# Patient Record
Sex: Male | Born: 1965
Health system: Southern US, Community
[De-identification: ages and names within clinical notes are randomized; demographics above are authoritative.]

## PROBLEM LIST (undated history)

## (undated) DIAGNOSIS — M2550 Pain in unspecified joint: Secondary | ICD-10-CM

## (undated) DIAGNOSIS — M549 Dorsalgia, unspecified: Secondary | ICD-10-CM

## (undated) DIAGNOSIS — M25562 Pain in left knee: Secondary | ICD-10-CM

## (undated) DIAGNOSIS — M1990 Unspecified osteoarthritis, unspecified site: Secondary | ICD-10-CM

## (undated) DIAGNOSIS — I10 Essential (primary) hypertension: Secondary | ICD-10-CM

## (undated) DIAGNOSIS — M25561 Pain in right knee: Secondary | ICD-10-CM

## (undated) DIAGNOSIS — M25559 Pain in unspecified hip: Secondary | ICD-10-CM

## (undated) HISTORY — DX: Pain in right knee: M25.561

## (undated) HISTORY — DX: Pain in left knee: M25.562

## (undated) HISTORY — DX: Unspecified osteoarthritis, unspecified site: M19.90

## (undated) HISTORY — DX: Pain in unspecified joint: M25.50

## (undated) HISTORY — DX: Pain in unspecified hip: M25.559

## (undated) HISTORY — DX: Dorsalgia, unspecified: M54.9

---
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
## 1985-11-01 HISTORY — PX: THUMB FUSION: SUR636

## 2010-11-04 ENCOUNTER — Emergency Department (HOSPITAL_COMMUNITY)
Admission: EM | Admit: 2010-11-04 | Discharge: 2010-11-04 | Disposition: A | Discharge status: Home / Self Care | Payer: Private Health Insurance - Indemnity | Attending: Emergency Medicine | Admitting: Emergency Medicine

## 2010-11-04 DIAGNOSIS — M549 Dorsalgia, unspecified: Secondary | ICD-10-CM | POA: Insufficient documentation

## 2010-11-04 LAB — URINALYSIS, ROUTINE W REFLEX MICROSCOPIC
Bilirubin Urine: NEGATIVE
Glucose, UA: NEGATIVE mg/dL
Hgb urine dipstick: NEGATIVE
Nitrite: NEGATIVE
Specific Gravity, Urine: 1.01 (ref 1.005–1.030)
pH: 6.5 (ref 5.0–8.0)

## 2010-11-07 LAB — URINE CULTURE
Colony Count: >=100000
Culture  Setup Time: 201207040124

## 2014-05-22 ENCOUNTER — Ambulatory Visit (HOSPITAL_COMMUNITY)
Admission: RE | Admit: 2014-05-22 | Discharge: 2014-05-22 | Disposition: A | Discharge status: Home / Self Care | Payer: 59 | Source: Ambulatory Visit | Attending: Family Medicine | Admitting: Family Medicine

## 2014-05-22 ENCOUNTER — Other Ambulatory Visit (HOSPITAL_COMMUNITY): Payer: Self-pay | Admitting: Family Medicine

## 2014-05-22 DIAGNOSIS — M25562 Pain in left knee: Secondary | ICD-10-CM

## 2015-03-13 ENCOUNTER — Other Ambulatory Visit (HOSPITAL_COMMUNITY): Payer: Self-pay | Admitting: Internal Medicine

## 2015-03-13 DIAGNOSIS — M5126 Other intervertebral disc displacement, lumbar region: Secondary | ICD-10-CM

## 2015-03-29 ENCOUNTER — Ambulatory Visit (HOSPITAL_COMMUNITY): Payer: 59

## 2015-04-08 ENCOUNTER — Ambulatory Visit (HOSPITAL_COMMUNITY): Payer: 59

## 2016-05-22 IMAGING — CR DG KNEE COMPLETE 4+V*L*
4 series · 4 of 4 positions shown · non-contrast
Comparison: None.

CLINICAL DATA: 48-year-old male with knee pain for the past year.
No injury. Initial encounter.

EXAM:
LEFT KNEE - COMPLETE 4+ VIEW

[view not recorded (1 of 4)]
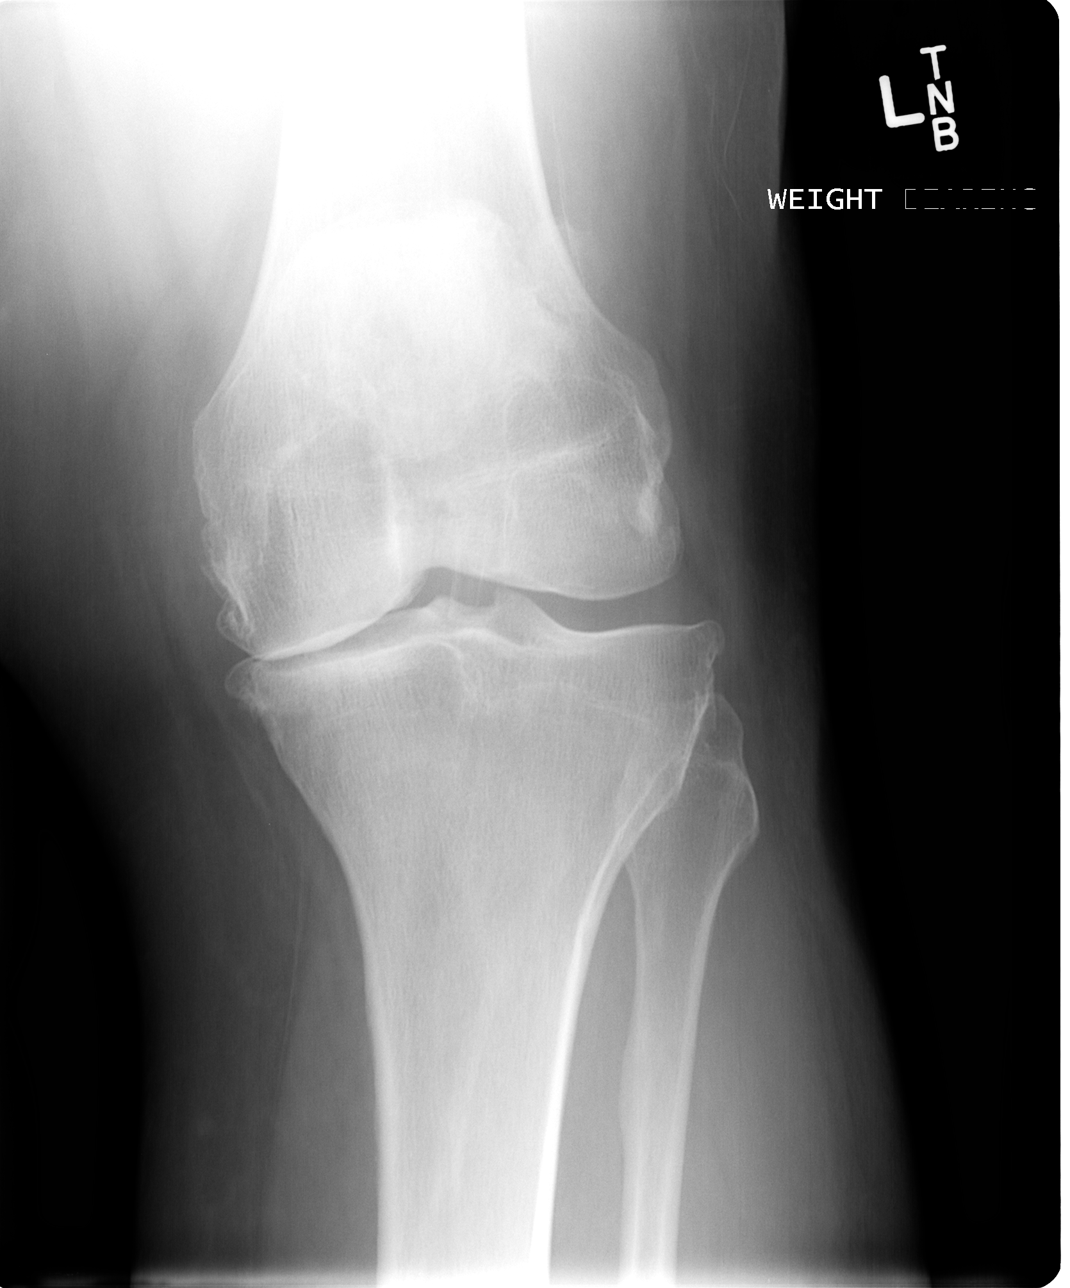

[view not recorded (2 of 4)]
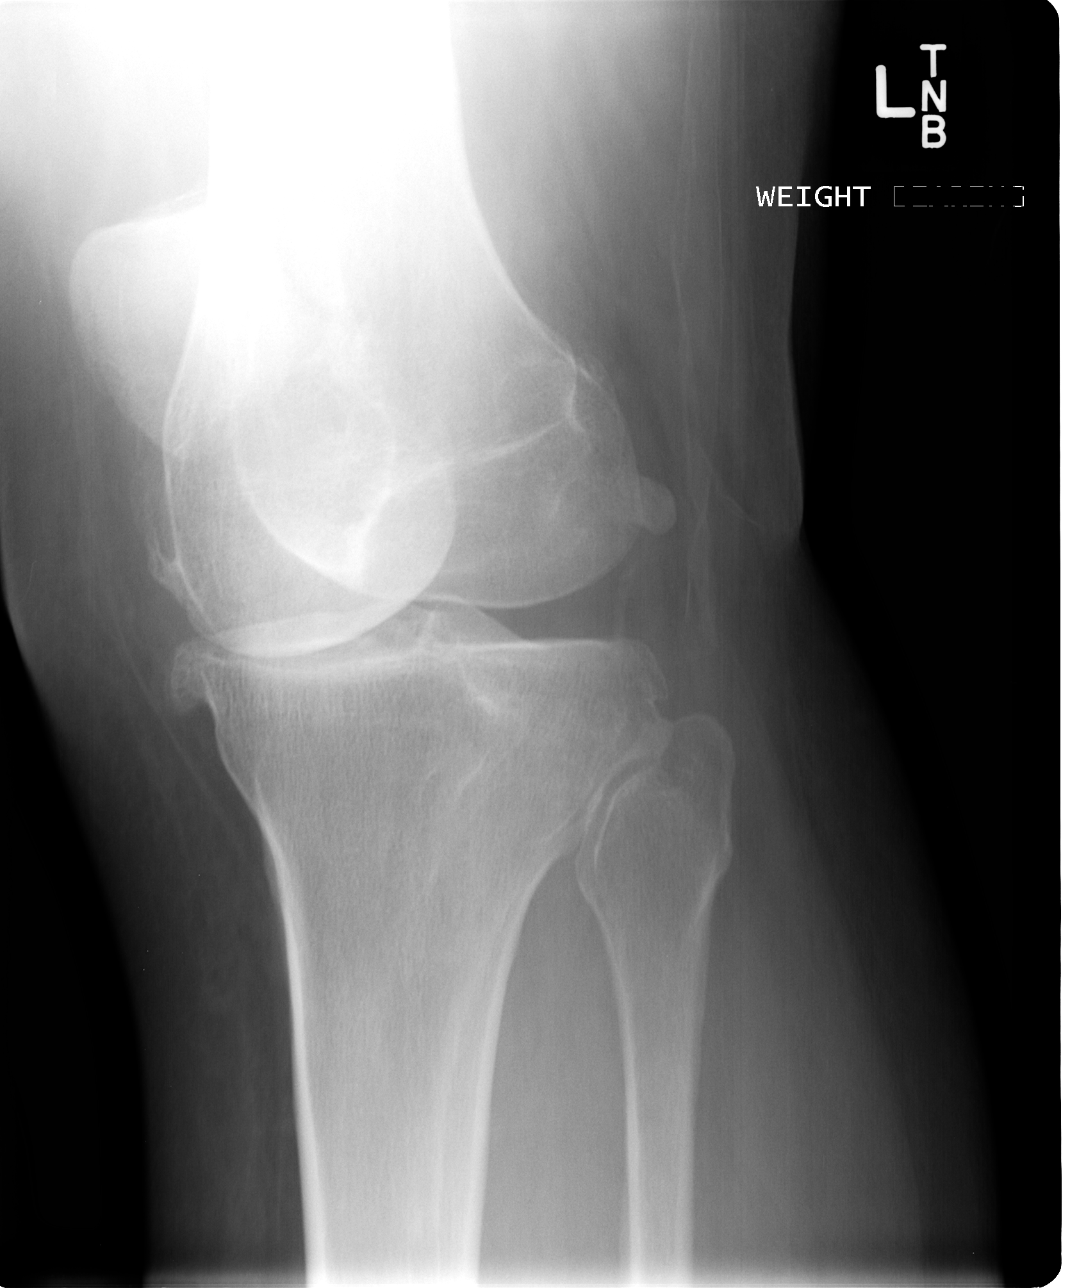

[view not recorded (3 of 4)]
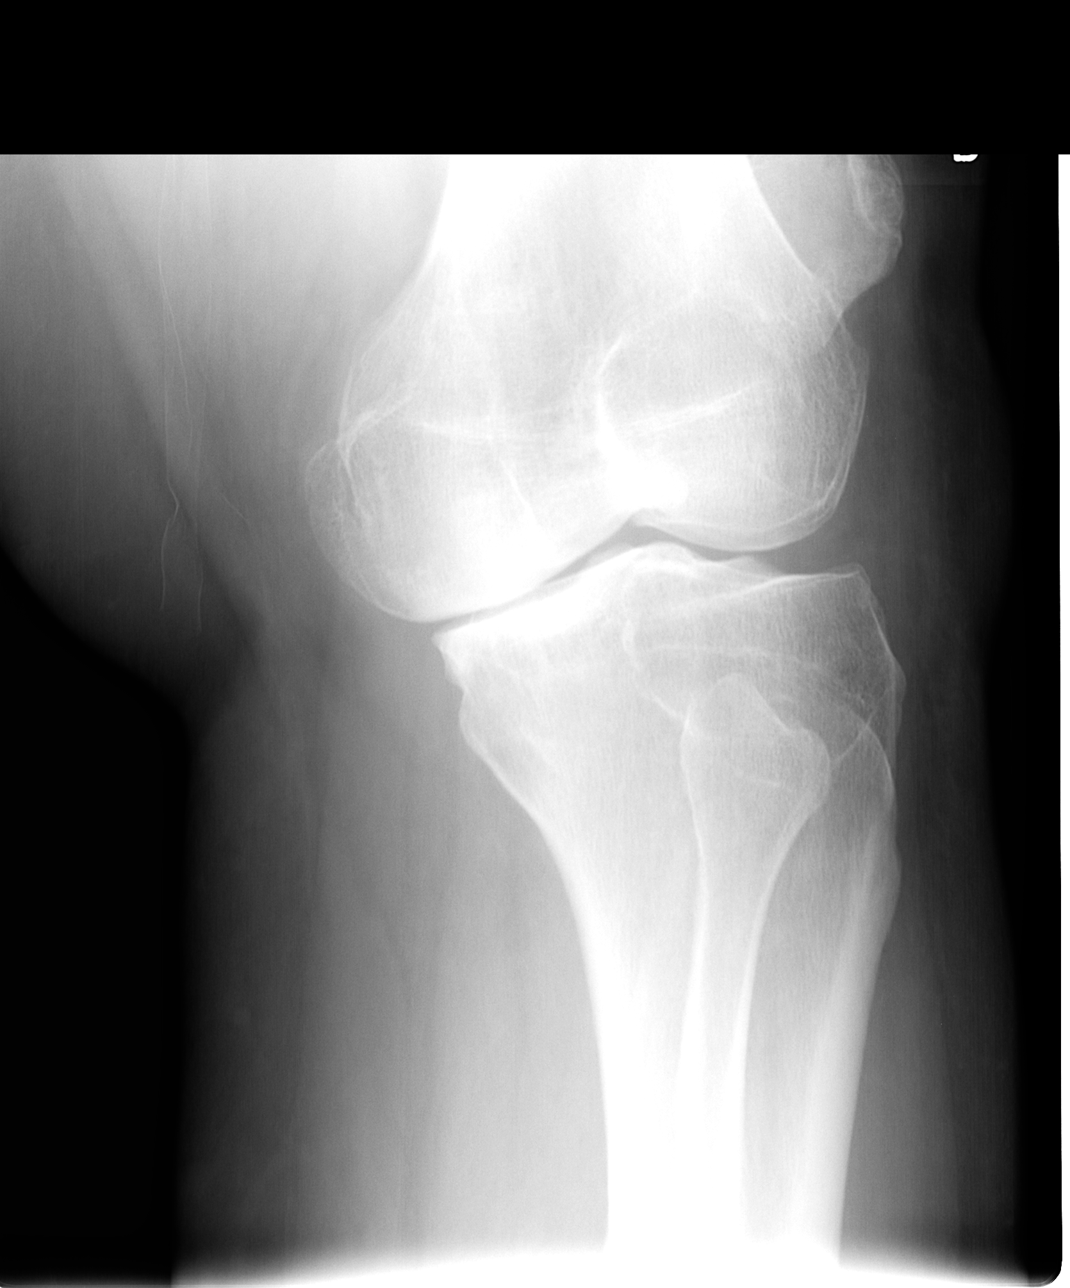

[view not recorded (4 of 4)]
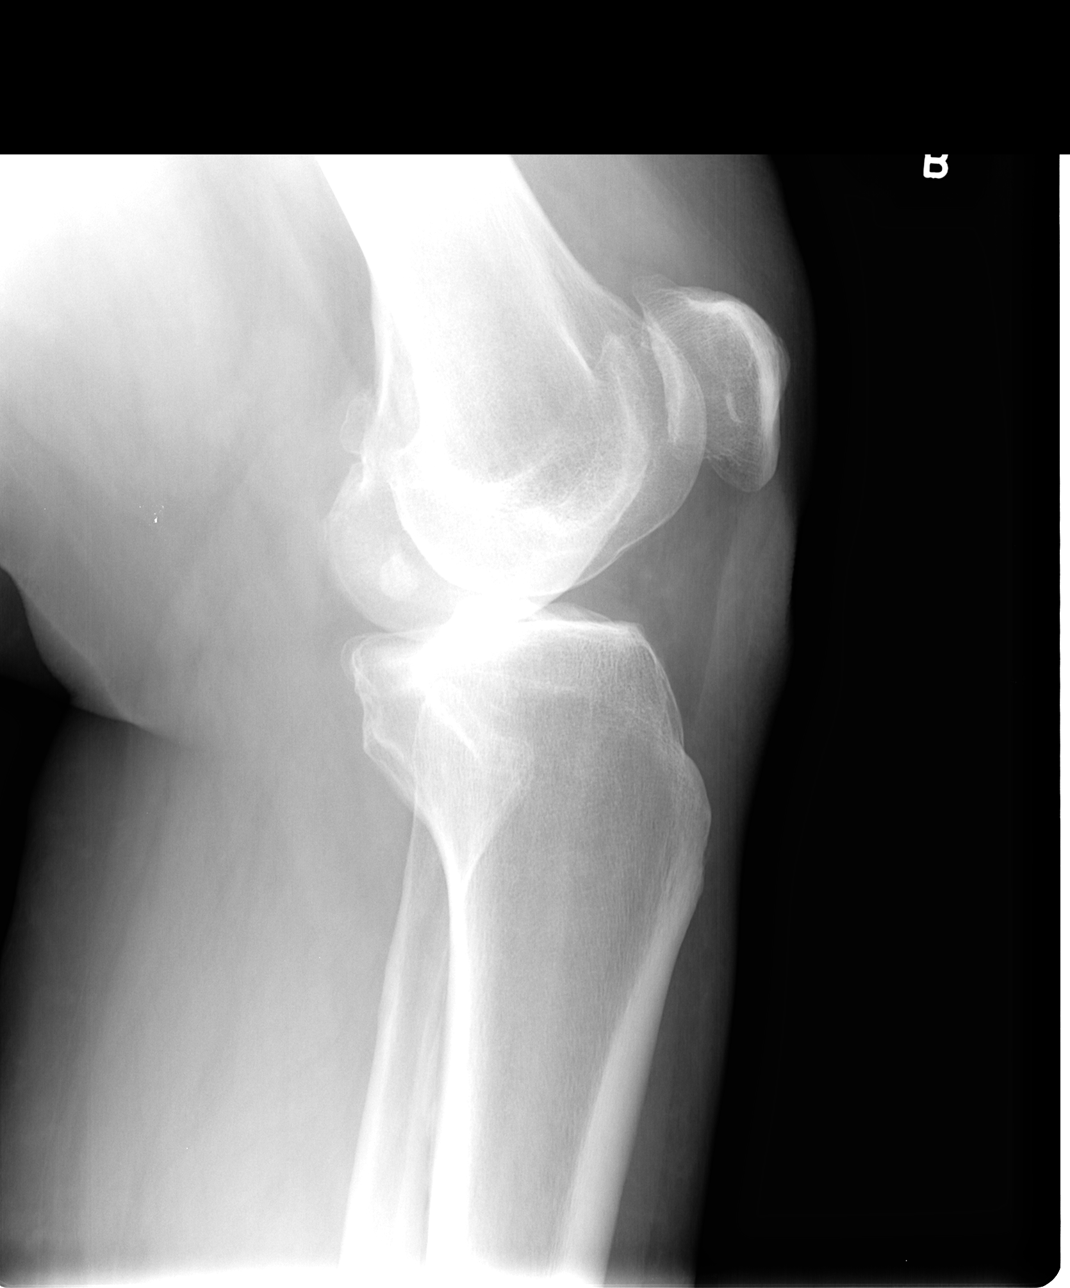

[4 of 4 positions shown; findings below may reference images not displayed]

FINDINGS: Prominent medial tibiofemoral joint degenerative changes with joint
space narrowing and osteophyte formation.

Mild patellofemoral joint degenerative changes.

Question small joint effusion.

No fracture or dislocation
IMPRESSION: Prominent medial tibiofemoral joint degenerative changes with joint
space narrowing and osteophyte formation.

Mild patellofemoral joint degenerative changes.

## 2016-07-15 ENCOUNTER — Encounter (HOSPITAL_COMMUNITY): Payer: Self-pay | Admitting: Family Medicine

## 2016-07-15 ENCOUNTER — Ambulatory Visit (HOSPITAL_COMMUNITY)
Admission: EM | Admit: 2016-07-15 | Discharge: 2016-07-15 | Disposition: A | Discharge status: Home / Self Care | Payer: Commercial Managed Care - HMO | Attending: Internal Medicine | Admitting: Internal Medicine

## 2016-07-15 DIAGNOSIS — N3001 Acute cystitis with hematuria: Secondary | ICD-10-CM | POA: Insufficient documentation

## 2016-07-15 DIAGNOSIS — I10 Essential (primary) hypertension: Secondary | ICD-10-CM | POA: Diagnosis not present

## 2016-07-15 DIAGNOSIS — R300 Dysuria: Secondary | ICD-10-CM | POA: Diagnosis not present

## 2016-07-15 HISTORY — DX: Essential (primary) hypertension: I10

## 2016-07-15 LAB — POCT URINALYSIS DIP (DEVICE)
Bilirubin Urine: NEGATIVE
GLUCOSE, UA: NEGATIVE mg/dL
KETONES UR: NEGATIVE mg/dL
Nitrite: NEGATIVE
PROTEIN: NEGATIVE mg/dL
SPECIFIC GRAVITY, URINE: 1.02 (ref 1.005–1.030)
UROBILINOGEN UA: 1 mg/dL (ref 0.0–1.0)
pH: 6 (ref 5.0–8.0)

## 2016-07-15 MED ORDER — PHENAZOPYRIDINE HCL 200 MG PO TABS: 200.0000 mg | ORAL_TABLET | Freq: Three times a day (TID) | ORAL | 0 refills | Status: DC | PRN

## 2016-07-15 MED ORDER — CEPHALEXIN 500 MG PO CAPS: 500.0000 mg | ORAL_CAPSULE | Freq: Four times a day (QID) | ORAL | 0 refills | Status: DC

## 2016-07-15 NOTE — ED Triage Notes (Signed)
Pt here for dysuria. No discharge noted.

## 2016-07-15 NOTE — ED Provider Notes (Signed)
CSN: [id number]     Arrival date & time 07/15/16  1555 History   None    Chief Complaint  Patient presents with  . Dysuria   (Consider location/radiation/quality/duration/timing/severity/associated sxs/prior Treatment) 51 year old male presents to clinic for evaluation of urinary type symptoms. He presents with a 24-hour history of dysuria, chills, fever, and nausea. He denies any urinary discharge. He states he is sexually active, he is married, does not use condoms, states he's been faithful to his wife, and is monogamous. Reports no rashes, lesions, or any skin irritation to his penis, scrotum, or groin area. He has no chest pain, shortness of breath, does have nausea, no abdominal pain, no diarrhea. No swelling in his hands, feet, ankles, skin rashes or skin lesions, no weakness, or dizziness.   The history is provided by the patient.    Past Medical History:  Diagnosis Date  . Hypertension    History reviewed. No pertinent surgical history. History reviewed. No pertinent family history. Social History  Substance Use Topics  . Smoking status: Never Smoker  . Smokeless tobacco: Never Used  . Alcohol use Not on file    Review of Systems  Reason unable to perform ROS: as covered in HPI.  Constitutional: Positive for chills and fatigue.  All other systems reviewed and are negative.   Allergies  Patient has no known allergies.  Home Medications   Prior to Admission medications   Medication Sig Start Date End Date Taking? Authorizing Provider  cephALEXin (KEFLEX) 500 MG capsule Take 1 capsule (500 mg total) by mouth 4 (four) times daily. 07/15/16   Dorena Bodo, NP  phenazopyridine (PYRIDIUM) 200 MG tablet Take 1 tablet (200 mg total) by mouth 3 (three) times daily as needed for pain. 07/15/16   Dorena Bodo, NP   Meds Ordered and Administered this Visit  Medications - No data to display  BP 134/85 (BP Location: Left Arm)   Pulse 119   Temp 98.5 F (36.9 C) (Oral)    Resp 16   SpO2 100%  No data found.   Physical Exam  Constitutional: He is oriented to person, place, and time. He appears well-developed and well-nourished. No distress.  HENT:  Head: Normocephalic and atraumatic.  Right Ear: External ear normal.  Left Ear: External ear normal.  Cardiovascular: Normal rate and regular rhythm.   Pulmonary/Chest: Effort normal and breath sounds normal.  Abdominal: Soft. Bowel sounds are normal. He exhibits no distension. There is no tenderness. There is no guarding.  Genitourinary: Testes normal and penis normal. Right testis shows no mass and no tenderness. Left testis shows no mass and no tenderness. Circumcised. No hypospadias, penile erythema or penile tenderness. No discharge found.  Musculoskeletal: He exhibits no edema or tenderness.  Neurological: He is alert and oriented to person, place, and time.  Skin: Skin is warm and dry. Capillary refill takes less than 2 seconds. He is not diaphoretic.  Psychiatric: He has a normal mood and affect. His behavior is normal.  Nursing note and vitals reviewed.   Urgent Care Course     Procedures (including critical care time)  Labs Review Labs Reviewed  POCT URINALYSIS DIP (DEVICE) - Abnormal; Notable for the following:       Result Value   Hgb urine dipstick MODERATE (*)    Leukocytes, UA SMALL (*)    All other components within normal limits  URINE CULTURE  URINE CYTOLOGY ANCILLARY ONLY    Imaging Review No results found.  MDM   1. Acute cystitis with hematuria     You are being treated today for a urinary tract infection. I have prescribed Keflex, take 1 tablet 4 times a day for 5 days. I have also prescribed Pyridium. Take 1 tablet a day 3 times a day for 2 days. Your urine will be sent for culture and you will be notified should any change in therapy be needed. Drink plenty of fluids and rest. Should your symptoms fail to resolve, follow up with your primary care provider or  return to clinic.     Dorena BodoLawrence Yuliza Cara, NP 07/15/16 1651

## 2016-07-15 NOTE — Discharge Instructions (Signed)
You are being treated today for a urinary tract infection. I have prescribed Keflex, take 1 tablet 4 times a day for 5 days. I have also prescribed Pyridium. Take 1 tablet a day 3 times a day for 2 days. Your urine will be sent for culture and you will be notified should any change in therapy be needed. Drink plenty of fluids and rest. Should your symptoms fail to resolve, follow up with your primary care provider or return to clinic.  °

## 2016-07-16 LAB — URINE CYTOLOGY ANCILLARY ONLY
Chlamydia: NEGATIVE
Neisseria Gonorrhea: NEGATIVE

## 2016-07-18 LAB — URINE CULTURE

## 2016-09-29 DIAGNOSIS — M25562 Pain in left knee: Secondary | ICD-10-CM | POA: Diagnosis not present

## 2016-09-29 DIAGNOSIS — M25561 Pain in right knee: Secondary | ICD-10-CM | POA: Diagnosis not present

## 2016-10-09 DIAGNOSIS — Z0001 Encounter for general adult medical examination with abnormal findings: Secondary | ICD-10-CM | POA: Diagnosis not present

## 2016-10-09 DIAGNOSIS — M1991 Primary osteoarthritis, unspecified site: Secondary | ICD-10-CM | POA: Diagnosis not present

## 2016-10-09 DIAGNOSIS — Z1389 Encounter for screening for other disorder: Secondary | ICD-10-CM | POA: Diagnosis not present

## 2016-10-09 DIAGNOSIS — I10 Essential (primary) hypertension: Secondary | ICD-10-CM | POA: Diagnosis not present

## 2016-10-09 DIAGNOSIS — E748 Other specified disorders of carbohydrate metabolism: Secondary | ICD-10-CM | POA: Diagnosis not present

## 2017-05-25 DIAGNOSIS — M25562 Pain in left knee: Secondary | ICD-10-CM | POA: Diagnosis not present

## 2017-05-25 DIAGNOSIS — M25561 Pain in right knee: Secondary | ICD-10-CM | POA: Diagnosis not present

## 2017-08-14 DIAGNOSIS — H1132 Conjunctival hemorrhage, left eye: Secondary | ICD-10-CM | POA: Diagnosis not present

## 2017-12-02 DIAGNOSIS — M170 Bilateral primary osteoarthritis of knee: Secondary | ICD-10-CM | POA: Diagnosis not present

## 2017-12-20 DIAGNOSIS — R197 Diarrhea, unspecified: Secondary | ICD-10-CM | POA: Diagnosis not present

## 2017-12-20 DIAGNOSIS — M1991 Primary osteoarthritis, unspecified site: Secondary | ICD-10-CM | POA: Diagnosis not present

## 2017-12-20 DIAGNOSIS — Z0001 Encounter for general adult medical examination with abnormal findings: Secondary | ICD-10-CM | POA: Diagnosis not present

## 2017-12-20 DIAGNOSIS — Z1389 Encounter for screening for other disorder: Secondary | ICD-10-CM | POA: Diagnosis not present

## 2017-12-20 DIAGNOSIS — Z23 Encounter for immunization: Secondary | ICD-10-CM | POA: Diagnosis not present

## 2018-06-23 DIAGNOSIS — M170 Bilateral primary osteoarthritis of knee: Secondary | ICD-10-CM | POA: Diagnosis not present

## 2019-12-27 ENCOUNTER — Other Ambulatory Visit: Payer: Self-pay

## 2019-12-27 ENCOUNTER — Encounter (INDEPENDENT_AMBULATORY_CARE_PROVIDER_SITE_OTHER): Payer: Self-pay | Admitting: Family Medicine

## 2019-12-27 ENCOUNTER — Ambulatory Visit (INDEPENDENT_AMBULATORY_CARE_PROVIDER_SITE_OTHER): Payer: 59 | Admitting: Family Medicine

## 2019-12-27 VITALS — BP 118/84 | HR 85 | Temp 98.3°F | Ht 71.0 in | Wt 335.0 lb

## 2019-12-27 DIAGNOSIS — R5383 Other fatigue: Secondary | ICD-10-CM

## 2019-12-27 DIAGNOSIS — R7303 Prediabetes: Secondary | ICD-10-CM

## 2019-12-27 DIAGNOSIS — R0602 Shortness of breath: Secondary | ICD-10-CM

## 2019-12-27 DIAGNOSIS — I10 Essential (primary) hypertension: Secondary | ICD-10-CM

## 2019-12-27 DIAGNOSIS — Z1331 Encounter for screening for depression: Secondary | ICD-10-CM | POA: Diagnosis not present

## 2019-12-27 DIAGNOSIS — Z9189 Other specified personal risk factors, not elsewhere classified: Secondary | ICD-10-CM

## 2019-12-27 DIAGNOSIS — Z6841 Body Mass Index (BMI) 40.0 and over, adult: Secondary | ICD-10-CM

## 2019-12-27 DIAGNOSIS — Z0289 Encounter for other administrative examinations: Secondary | ICD-10-CM

## 2019-12-28 LAB — CBC WITH DIFFERENTIAL/PLATELET
Basophils Absolute: 0.1 10*3/uL (ref 0.0–0.2)
Basos: 1 %
EOS (ABSOLUTE): 0.2 10*3/uL (ref 0.0–0.4)
Eos: 3 %
Hematocrit: 45.7 % (ref 37.5–51.0)
Hemoglobin: 14.2 g/dL (ref 13.0–17.7)
Immature Grans (Abs): 0.1 10*3/uL (ref 0.0–0.1)
Immature Granulocytes: 1 %
Lymphocytes Absolute: 1.9 10*3/uL (ref 0.7–3.1)
Lymphs: 25 %
MCH: 26.3 pg — ABNORMAL LOW (ref 26.6–33.0)
MCHC: 31.1 g/dL — ABNORMAL LOW (ref 31.5–35.7)
MCV: 85 fL (ref 79–97)
Monocytes Absolute: 0.7 10*3/uL (ref 0.1–0.9)
Monocytes: 9 %
Neutrophils Absolute: 4.5 10*3/uL (ref 1.4–7.0)
Neutrophils: 61 %
Platelets: 277 10*3/uL (ref 150–450)
RBC: 5.4 x10E6/uL (ref 4.14–5.80)
RDW: 12.7 % (ref 11.6–15.4)
WBC: 7.4 10*3/uL (ref 3.4–10.8)

## 2019-12-28 LAB — COMPREHENSIVE METABOLIC PANEL
ALT: 19 IU/L (ref 0–44)
AST: 19 IU/L (ref 0–40)
Albumin/Globulin Ratio: 1.8 (ref 1.2–2.2)
Albumin: 4.6 g/dL (ref 3.8–4.9)
Alkaline Phosphatase: 63 IU/L (ref 48–121)
BUN/Creatinine Ratio: 24 — ABNORMAL HIGH (ref 9–20)
BUN: 27 mg/dL — ABNORMAL HIGH (ref 6–24)
Bilirubin Total: 0.3 mg/dL (ref 0.0–1.2)
CO2: 25 mmol/L (ref 20–29)
Calcium: 9.8 mg/dL (ref 8.7–10.2)
Chloride: 104 mmol/L (ref 96–106)
Creatinine, Ser: 1.13 mg/dL (ref 0.76–1.27)
GFR calc Af Amer: 84 mL/min/{1.73_m2} (ref >59)
GFR calc non Af Amer: 73 mL/min/{1.73_m2} (ref >59)
Globulin, Total: 2.5 g/dL (ref 1.5–4.5)
Glucose: 99 mg/dL (ref 65–99)
Potassium: 4.2 mmol/L (ref 3.5–5.2)
Sodium: 143 mmol/L (ref 134–144)
Total Protein: 7.1 g/dL (ref 6.0–8.5)

## 2019-12-28 LAB — LIPID PANEL WITH LDL/HDL RATIO
Cholesterol, Total: 132 mg/dL (ref 100–199)
HDL: 34 mg/dL — ABNORMAL LOW (ref >39)
LDL Chol Calc (NIH): 77 mg/dL (ref 0–99)
LDL/HDL Ratio: 2.3 ratio (ref 0.0–3.6)
Triglycerides: 112 mg/dL (ref 0–149)
VLDL Cholesterol Cal: 21 mg/dL (ref 5–40)

## 2019-12-28 LAB — VITAMIN D 25 HYDROXY (VIT D DEFICIENCY, FRACTURES): Vit D, 25-Hydroxy: 15.6 ng/mL — ABNORMAL LOW (ref 30.0–100.0)

## 2019-12-28 LAB — T3: T3, Total: 129 ng/dL (ref 71–180)

## 2019-12-28 LAB — VITAMIN B12: Vitamin B-12: 337 pg/mL (ref 232–1245)

## 2019-12-28 LAB — TSH: TSH: 1.61 u[IU]/mL (ref 0.450–4.500)

## 2019-12-28 LAB — T4: T4, Total: 7.4 ug/dL (ref 4.5–12.0)

## 2019-12-28 LAB — FOLATE: Folate: 8.3 ng/mL (ref >3.0)

## 2019-12-28 LAB — HEMOGLOBIN A1C
Est. average glucose Bld gHb Est-mCnc: 131 mg/dL
Hgb A1c MFr Bld: 6.2 % — ABNORMAL HIGH (ref 4.8–5.6)

## 2019-12-28 LAB — INSULIN, RANDOM: INSULIN: 20.6 u[IU]/mL (ref 2.6–24.9)

## 2019-12-28 NOTE — Progress Notes (Signed)
Chief Complaint:   OBESITY Riley Hurley (MR# [id number]) is a 54 y.o. male who presents for evaluation and treatment of obesity and related comorbidities. Current BMI is Body mass index is 46.72 kg/m. Riley Hurley has been struggling with his weight for many years and has been unsuccessful in either losing weight, maintaining weight loss, or reaching his healthy weight goal.  Riley Hurley is currently in the action stage of change and ready to dedicate time achieving and maintaining a healthier weight. Riley Hurley is interested in becoming our patient and working on intensive lifestyle modifications including (but not limited to) diet and exercise for weight loss.  Riley Hurley's habits were reviewed today and are as follows: he struggles with family and or coworkers weight loss sabotage, his desired weight loss is 115 lbs, he started gaining weight 2000, his heaviest weight ever was 377 pounds, he is a picky eater and doesn't like to eat healthier foods, he has significant food cravings issues, he snacks frequently in the evenings, he skips meals frequently and he frequently eats larger portions than normal.  Depression Screen Riley Hurley's Food and Mood (modified PHQ-9) score was 4.  Depression screen [facility] 2/9 12/27/2019  Decreased Interest 2  Down, Depressed, Hopeless 0  PHQ - 2 Score 2  Altered sleeping 1  Tired, decreased energy 1  Change in appetite 0  Feeling bad or failure about yourself  0  Trouble concentrating 0  Moving slowly or fidgety/restless 0  Suicidal thoughts 0  PHQ-9 Score 4  Difficult doing work/chores Not difficult at all   Subjective:   1. Other fatigue Riley Hurley admits to daytime somnolence and denies waking up still tired. Patent has a history of symptoms of daytime fatigue. Riley Hurley generally gets 7 hours of sleep per night, and states that he has generally restful sleep. Snoring is present. Apneic episodes are present. Epworth Sleepiness Score is 6. EKG-normal sinus rhythm at 98 BPM.  2. SOB  (shortness of breath) on exertion Riley Hurley notes increasing shortness of breath with exercising and seems to be worsening over time with weight gain. He notes getting out of breath sooner with activity than he used to. This has not gotten worse recently. Riley Hurley denies shortness of breath at rest or orthopnea.  3. Essential hypertension Riley Hurley's blood pressure is well controlled today. He has been on losartan and hydrochlorothiazide for 3 years. He denies chest pain, chest pressure, or headache.  4. Pre-diabetes Riley Hurley's most recent A1c was 5.7. He is not on medications.  5. At risk for diabetes mellitus Riley Hurley is at higher than average risk for developing diabetes due to his obesity.   Assessment/Plan:   1. Other fatigue Riley Hurley does feel that his weight is causing his energy to be lower than it should be. Fatigue may be related to obesity, depression or many other causes. Labs will be ordered, and in the meanwhile, Riley Hurley will focus on self care including making healthy food choices, increasing physical activity and focusing on stress reduction.  - EKG 12-Lead - Vitamin B12 - Folate - T3 - T4 - TSH - VITAMIN D 25 Hydroxy (Vit-D Deficiency, Fractures)  2. SOB (shortness of breath) on exertion Riley Hurley does feel that he gets out of breath more easily that he used to when he exercises. Riley Hurley's shortness of breath appears to be obesity related and exercise induced. He has agreed to work on weight loss and gradually increase exercise to treat his exercise induced shortness of breath. Will continue to monitor closely.  -  CBC with Differential/Platelet  3. Essential hypertension Riley Hurley is working on healthy weight loss and exercise to improve blood pressure control. We will watch for signs of hypotension as he continues his lifestyle modifications.  - Comprehensive metabolic panel - Lipid Panel With LDL/HDL Ratio  4. Pre-diabetes Riley Hurley will continue to work on weight loss, exercise, and decreasing  simple carbohydrates to help decrease the risk of diabetes.   - Hemoglobin A1c - Insulin, random  5. Depression screening Riley Hurley had a negative depression screening. Depression is commonly associated with obesity and often results in emotional eating behaviors. We will monitor this closely and work on CBT to help improve the non-hunger eating patterns. Referral to Psychology may be required if no improvement is seen as he continues in our clinic.  6. At risk for diabetes mellitus Riley Hurley was given approximately 15 minutes of diabetes education and counseling today. We discussed intensive lifestyle modifications today with an emphasis on weight loss as well as increasing exercise and decreasing simple carbohydrates in his diet. We also reviewed medication options with an emphasis on risk versus benefit of those discussed.   Repetitive spaced learning was employed today to elicit superior memory formation and behavioral change.  7. Class 3 severe obesity with serious comorbidity and body mass index (BMI) of 45.0 to 49.9 in adult, unspecified obesity type Riley Hurley) Riley Hurley is currently in the action stage of change and his goal is to continue with weight loss efforts. I recommend Riley Hurley begin the structured treatment plan as follows:  He has agreed to the Category 3 Plan + 100 calories.  Exercise goals: No exercise has been prescribed at this time.   Behavioral modification strategies: increasing lean protein intake, decreasing eating out, no skipping meals and planning for success.  He was informed of the importance of frequent follow-up visits to maximize his success with intensive lifestyle modifications for his multiple health conditions. He was informed we would discuss his lab results at his next visit unless there is a critical issue that needs to be addressed sooner. Riley Hurley agreed to keep his next visit at the agreed upon time to discuss these results.  Objective:   Blood pressure 118/84, pulse  85, temperature 98.3 F (36.8 C), temperature source Oral, height 5\' 11"  (1.803 m), weight (!) 335 lb (152 kg), SpO2 96 %. Body mass index is 46.72 kg/m.  EKG: Normal sinus rhythm, rate 98 BPM.  Indirect Calorimeter completed today shows a VO2 of 337 and a REE of 2349.  His calculated basal metabolic rate is thus his basal metabolic rate is worse than expected.  General: Cooperative, alert, well developed, in no acute distress. HEENT: Conjunctivae and lids unremarkable. Cardiovascular: Regular rhythm.  Lungs: Normal work of breathing. Neurologic: No focal deficits.   Lab Results  Component Value Date   CREATININE 1.13 12/27/2019   BUN 27 (H) 12/27/2019   NA 143 12/27/2019   K 4.2 12/27/2019   CL 104 12/27/2019   CO2 25 12/27/2019   Lab Results  Component Value Date   ALT 19 12/27/2019   AST 19 12/27/2019   ALKPHOS 63 12/27/2019   BILITOT 0.3 12/27/2019   Lab Results  Component Value Date   HGBA1C 6.2 (H) 12/27/2019   Lab Results  Component Value Date   INSULIN 20.6 12/27/2019   Lab Results  Component Value Date   TSH 1.610 12/27/2019   Lab Results  Component Value Date   CHOL 132 12/27/2019   HDL 34 (L) 12/27/2019  LDLCALC 77 12/27/2019   TRIG 112 12/27/2019   Lab Results  Component Value Date   WBC 7.4 12/27/2019   HGB 14.2 12/27/2019   HCT 45.7 12/27/2019   MCV 85 12/27/2019   PLT 277 12/27/2019   No results found for: IRON, TIBC, FERRITIN  Attestation Statements:   This is the patient's first visit at [facility]. The patient's NEW PATIENT PACKET was reviewed at length. Included in the packet: current and past health history, medications, allergies, ROS, gynecologic history (women only), surgical history, family history, social history, weight history, weight loss surgery history (for those that have had weight loss surgery), nutritional evaluation, mood and food questionnaire, PHQ9, Epworth questionnaire, sleep habits  questionnaire, patient life and health improvement goals questionnaire. These will all be scanned into the patient's chart under media.   During the visit, I independently reviewed the patient's EKG, bioimpedance scale results, and indirect calorimeter results. I used this information to tailor a meal plan for the patient that will help him to lose weight and will improve his obesity-related conditions going forward. I performed a medically necessary appropriate examination and/or evaluation. I discussed the assessment and treatment plan with the patient. The patient was provided an opportunity to ask questions and all were answered. The patient agreed with the plan and demonstrated an understanding of the instructions. Labs were ordered at this visit and will be reviewed at the next visit unless more critical results need to be addressed immediately. Clinical information was updated and documented in the EMR.   Time spent on visit including pre-visit chart review and post-visit care was 45 minutes.   A separate 15 minutes was spent on risk counseling (see above).     I, Burt Knack, am acting as transcriptionist for Reuben Likes, MD.  I have reviewed the above documentation for accuracy and completeness, and I agree with the above. - Katherina Mires, MD

## 2020-01-05 ENCOUNTER — Encounter (INDEPENDENT_AMBULATORY_CARE_PROVIDER_SITE_OTHER): Payer: Self-pay | Admitting: Family Medicine

## 2020-01-10 ENCOUNTER — Ambulatory Visit (INDEPENDENT_AMBULATORY_CARE_PROVIDER_SITE_OTHER): Payer: Self-pay | Admitting: Family Medicine

## 2020-01-11 ENCOUNTER — Other Ambulatory Visit: Payer: Self-pay

## 2020-01-11 ENCOUNTER — Encounter (INDEPENDENT_AMBULATORY_CARE_PROVIDER_SITE_OTHER): Payer: Self-pay | Admitting: Family Medicine

## 2020-01-11 ENCOUNTER — Ambulatory Visit (INDEPENDENT_AMBULATORY_CARE_PROVIDER_SITE_OTHER): Payer: 59 | Admitting: Family Medicine

## 2020-01-11 VITALS — BP 113/74 | HR 82 | Temp 98.1°F | Ht 71.0 in | Wt 330.0 lb

## 2020-01-11 DIAGNOSIS — I10 Essential (primary) hypertension: Secondary | ICD-10-CM

## 2020-01-11 DIAGNOSIS — R7303 Prediabetes: Secondary | ICD-10-CM | POA: Diagnosis not present

## 2020-01-11 DIAGNOSIS — E559 Vitamin D deficiency, unspecified: Secondary | ICD-10-CM

## 2020-01-11 DIAGNOSIS — Z6841 Body Mass Index (BMI) 40.0 and over, adult: Secondary | ICD-10-CM

## 2020-01-13 NOTE — Progress Notes (Signed)
Chief Complaint:   OBESITY Riley Hurley is here to discuss his progress with his obesity treatment plan along with follow-up of his obesity related diagnoses. Riley Hurley is on the Category 3 Plan and states he is following his eating plan approximately 100% of the time. Riley Hurley states he is walking and doing strength training several times per week.  Today's visit was #: 2 Total lbs lost since last in-office visit: 5  Interim History: Riley Hurley has logged food into a spreadsheet. Average calories 1588, average protein 121 grams, average carbohydrates < 20 g net. Fasting BG 104, 113. He has been walking and doing strength training. His work keeps him very busy - traveling and lots of meetings. He has five children at home (ten total). His goal is to be able to avoid medications.  Assessment/Plan:   1. Vitamin D deficiency Not at goal. Optimal goal > 50 ng/dL. There is also evidence to support a goal of >70 ng/dL in patients with cancer and heart disease. Plan: Continue Vitamin D OTC supplementation. Patient has already started this.  2. Prediabetes Not optimized. Goal is HgbA1c < 5.7 and insulin level closer to 5. Lab results reviewed with patient.  Lab Results  Component Value Date   HGBA1C 6.2 (H) 12/27/2019   Lab Results  Component Value Date   INSULIN 20.6 12/27/2019   3. Essential hypertension At goal. Medications: Losartan, HCTZ. Tolerates well without side effects. The current medical regimen is effective;  continue present plan and medications. Lab results reviewed with patient.  BP Readings from Last 3 Encounters:  01/11/20 113/74  12/27/19 118/84  07/15/16 134/85   Lab Results  Component Value Date   CREATININE 1.13 12/27/2019   The 10-year ASCVD risk score Denman George DC Jr., et al., 2013) is: 8.7%   Values used to calculate the score:     Age: 54 years     Sex: Male     Is Non-Hispanic African American: Yes     Diabetic: No     Tobacco smoker: No     Systolic Blood Pressure:  113 mmHg     Is BP treated: Yes     HDL Cholesterol: 34 mg/dL     Total Cholesterol: 132 mg/dL  4. Class 3 severe obesity with serious comorbidity and body mass index (BMI) of 45.0 to 49.9 in adult, unspecified obesity type Riley Hurley)  Riley Hurley is currently in the action stage of change. As such, his goal is to continue with weight loss efforts. He has agreed to the Category 3 Plan + 100 calories.   Exercise goals: For substantial health benefits, adults should do at least 150 minutes (2 hours and 30 minutes) a week of moderate-intensity, or 75 minutes (1 hour and 15 minutes) a week of vigorous-intensity aerobic physical activity, or an equivalent combination of moderate- and vigorous-intensity aerobic activity. Aerobic activity should be performed in episodes of at least 10 minutes, and preferably, it should be spread throughout the week. Adults should also include muscle-strengthening activities that involve all major muscle groups on 2 or more days a week.  Behavioral modification strategies: increasing lean protein intake, decreasing simple carbohydrates, increasing vegetables, increasing water intake, decreasing sodium intake and increasing high fiber foods.  Riley Hurley has agreed to follow-up with our clinic in 2 weeks. He was informed of the importance of frequent follow-up visits to maximize his success with intensive lifestyle modifications for his multiple health conditions.   Objective:   Blood pressure 113/74, pulse 82, temperature 98.1  F (36.7 C), temperature source Oral, height 5\' 11"  (1.803 m), weight (!) 330 lb (149.7 kg), SpO2 94 %. Body mass index is 46.03 kg/m.  General: Cooperative, alert, well developed, in no acute distress. HEENT: Conjunctivae and lids unremarkable. Cardiovascular: Regular rhythm.  Lungs: Normal work of breathing. Neurologic: No focal deficits.   Lab Results  Component Value Date   CREATININE 1.13 12/27/2019   BUN 27 (H) 12/27/2019   NA 143 12/27/2019   K  4.2 12/27/2019   CL 104 12/27/2019   CO2 25 12/27/2019   Lab Results  Component Value Date   ALT 19 12/27/2019   AST 19 12/27/2019   ALKPHOS 63 12/27/2019   BILITOT 0.3 12/27/2019   Lab Results  Component Value Date   HGBA1C 6.2 (H) 12/27/2019   Lab Results  Component Value Date   INSULIN 20.6 12/27/2019   Lab Results  Component Value Date   TSH 1.610 12/27/2019   Lab Results  Component Value Date   CHOL 132 12/27/2019   HDL 34 (L) 12/27/2019   LDLCALC 77 12/27/2019   TRIG 112 12/27/2019   Lab Results  Component Value Date   WBC 7.4 12/27/2019   HGB 14.2 12/27/2019   HCT 45.7 12/27/2019   MCV 85 12/27/2019   PLT 277 12/27/2019   Attestation Statements:   Reviewed by clinician on day of visit: allergies, medications, problem list, medical history, surgical history, family history, social history, and previous encounter notes. Time spent pre and post visit: 45 minutes.

## 2020-01-15 ENCOUNTER — Encounter (INDEPENDENT_AMBULATORY_CARE_PROVIDER_SITE_OTHER): Payer: Self-pay | Admitting: Family Medicine

## 2020-01-31 ENCOUNTER — Ambulatory Visit (INDEPENDENT_AMBULATORY_CARE_PROVIDER_SITE_OTHER): Payer: 59 | Admitting: Family Medicine

## 2020-01-31 ENCOUNTER — Encounter (INDEPENDENT_AMBULATORY_CARE_PROVIDER_SITE_OTHER): Payer: Self-pay | Admitting: Family Medicine

## 2020-01-31 ENCOUNTER — Other Ambulatory Visit: Payer: Self-pay

## 2020-01-31 VITALS — BP 115/73 | HR 88 | Temp 98.5°F | Ht 71.0 in | Wt 320.0 lb

## 2020-01-31 DIAGNOSIS — R7303 Prediabetes: Secondary | ICD-10-CM

## 2020-01-31 DIAGNOSIS — Z6841 Body Mass Index (BMI) 40.0 and over, adult: Secondary | ICD-10-CM

## 2020-01-31 DIAGNOSIS — E559 Vitamin D deficiency, unspecified: Secondary | ICD-10-CM

## 2020-01-31 NOTE — Progress Notes (Signed)
Chief Complaint:   OBESITY Riley Hurley is here to discuss his progress with his obesity treatment plan along with follow-up of his obesity related diagnoses. Riley Hurley is on the Category 3 Plan + 100 calories and states he is following his eating plan approximately 65-70% of the time. Riley Hurley states he is working, [employer], and walking for 30-60 minutes 2-3 times per week.  Today's visit was #: 3 Starting weight: 335 lbs Starting date: 12/27/2019 Today's weight: 320 lbs Today's date: 01/31/2020 Total lbs lost to date: 15 Total lbs lost since last in-office visit: 10  Interim History: Riley Hurley states sometimes he is not eating dinner or just making the best choices he has available to him. He is trying to stay away from all sugars or additional sugars. He is doing 3 boiled eggs or Sam's protein shake ([employer]). Lunch is whatever he can get, sandwich, or eggs or protein shakes or cheese burger. He is still working on forcing himself to eat frequently. He has a food journal averaging 1400 calories and protein of 104 grams daily.  Subjective:   1. Pre-diabetes [name]'s last A1c was 6.2 and insulin 20.6. He is not on medications, and he is avoiding all carbohydrates and getting <30 grams of carbohydrates per day.  2. Vitamin D deficiency Riley Hurley is on Vit D supplementation (multivitamin and Vit D daily). He notes fatigue.  Assessment/Plan:   1. Pre-diabetes Riley Hurley will continue to work on weight loss, exercise, and decreasing simple carbohydrates to help decrease the risk of diabetes. We will repeat labs in 3 months, no change in treatment.  2. Vitamin D deficiency Low Vitamin D level contributes to fatigue and are associated with obesity, breast, and colon cancer. We will repeat labs in 3 months, no prescription needed at this time. Riley Hurley will follow-up for routine testing of Vitamin D, at least 2-3 times per year to avoid over-replacement.  3. Class 3 severe obesity with serious comorbidity and body  mass index (BMI) of 40.0 to 44.9 in adult, unspecified obesity type [name]) Riley Hurley is currently in the action stage of change. As such, his goal is to continue with weight loss efforts. He has agreed to the Category 3 Plan or keeping a food journal and adhering to recommended goals of 1600-1700 calories and 110+ grams of protein daily.   Exercise goals: All adults should avoid inactivity. Some physical activity is better than none, and adults who participate in any amount of physical activity gain some health benefits.  Behavioral modification strategies: increasing lean protein intake, meal planning and cooking strategies, keeping healthy foods in the home and planning for success.  Riley Hurley has agreed to follow-up with our clinic in 2 to 3 weeks. He was informed of the importance of frequent follow-up visits to maximize his success with intensive lifestyle modifications for his multiple health conditions.   Objective:   Blood pressure 115/73, pulse 88, temperature 98.5 F (36.9 C), temperature source Oral, height 5\' 11"  (1.803 m), weight (!) 320 lb (145.2 kg), SpO2 97 %. Body mass index is 44.63 kg/m.  General: Cooperative, alert, well developed, in no acute distress. HEENT: Conjunctivae and lids unremarkable. Cardiovascular: Regular rhythm.  Lungs: Normal work of breathing. Neurologic: No focal deficits.   Lab Results  Component Value Date   CREATININE 1.13 12/27/2019   BUN 27 (H) 12/27/2019   NA 143 12/27/2019   K 4.2 12/27/2019   CL 104 12/27/2019   CO2 25 12/27/2019   Lab Results  Component Value Date  ALT 19 12/27/2019   AST 19 12/27/2019   ALKPHOS 63 12/27/2019   BILITOT 0.3 12/27/2019   Lab Results  Component Value Date   HGBA1C 6.2 (H) 12/27/2019   Lab Results  Component Value Date   INSULIN 20.6 12/27/2019   Lab Results  Component Value Date   TSH 1.610 12/27/2019   Lab Results  Component Value Date   CHOL 132 12/27/2019   HDL 34 (L) 12/27/2019   LDLCALC 77  12/27/2019   TRIG 112 12/27/2019   Lab Results  Component Value Date   WBC 7.4 12/27/2019   HGB 14.2 12/27/2019   HCT 45.7 12/27/2019   MCV 85 12/27/2019   PLT 277 12/27/2019   No results found for: IRON, TIBC, FERRITIN  Attestation Statements:   Reviewed by clinician on day of visit: allergies, medications, problem list, medical history, surgical history, family history, social history, and previous encounter notes.  Time spent on visit including pre-visit chart review and post-visit care and charting was 16 minutes.    I, Burt Knack, am acting as transcriptionist for Riley Likes, MD.  I have reviewed the above documentation for accuracy and completeness, and I agree with the above. - Katherina Mires, MD

## 2020-03-04 ENCOUNTER — Encounter (INDEPENDENT_AMBULATORY_CARE_PROVIDER_SITE_OTHER): Payer: Self-pay | Admitting: Family Medicine

## 2020-03-04 ENCOUNTER — Ambulatory Visit (INDEPENDENT_AMBULATORY_CARE_PROVIDER_SITE_OTHER): Payer: 59 | Admitting: Family Medicine

## 2020-03-04 ENCOUNTER — Other Ambulatory Visit: Payer: Self-pay

## 2020-03-04 VITALS — BP 104/71 | HR 97 | Temp 98.8°F | Ht 71.0 in | Wt 311.0 lb

## 2020-03-04 DIAGNOSIS — E559 Vitamin D deficiency, unspecified: Secondary | ICD-10-CM

## 2020-03-04 DIAGNOSIS — I10 Essential (primary) hypertension: Secondary | ICD-10-CM

## 2020-03-04 DIAGNOSIS — R7303 Prediabetes: Secondary | ICD-10-CM

## 2020-03-04 DIAGNOSIS — Z9189 Other specified personal risk factors, not elsewhere classified: Secondary | ICD-10-CM

## 2020-03-04 DIAGNOSIS — Z6841 Body Mass Index (BMI) 40.0 and over, adult: Secondary | ICD-10-CM

## 2020-03-04 MED ORDER — LOSARTAN POTASSIUM 50 MG PO TABS: 50.0000 mg | ORAL_TABLET | Freq: Every day | ORAL | 0 refills | Status: DC

## 2020-03-05 NOTE — Progress Notes (Signed)
Chief Complaint:   OBESITY Riley Hurley is here to discuss his progress with his obesity treatment plan along with follow-up of his obesity related diagnoses. Riley Hurley is on the Category 3 Plan or keeping a food journal and adhering to recommended goals of 1600-1700 calories and 110+ grams of protein daily and states he is following his eating plan approximately 70% of the time. Riley Hurley states he is walking 1-2 miles 2 times per week, and weight lifting for 40-45 minutes 2 times per week.  Today's visit was #: 4 Starting weight: 335 lbs Starting date: 12/27/2019 Today's weight: 311 lbs Today's date: 03/04/2020 Total lbs lost to date: 24 Total lbs lost since last in-office visit: 9  Interim History: Riley Hurley has returned to our clinic today since 9/29. He feels following the Category 3 plan a bit boring. Hardest part is getting in all of the food. He denies hunger. He did try to increase to 200-300 calories but the scale showed 4 lbs weight gain. He is able to get in 10-12 oz of meat at dinner.  Subjective:   1. Pre-diabetes Riley Hurley's last A1c was 6.2 and insulin 20.6. He is not on medications.  2. Essential hypertension Riley Hurley's blood pressure is well controlled today. He is on hydrochlorothiazide 25 mg daily and 100 mg of losartan.  3. Vitamin D deficiency Riley Hurley is on daily Vit D, and he notes fatigue.  4. At risk for activity intolerance Riley Hurley is at risk for exercise intolerance due to osteoarthritis.  Assessment/Plan:   1. Pre-diabetes Maciah will continue to work on weight loss, exercise, and decreasing simple carbohydrates to help decrease the risk of diabetes. We will follow up on labs at the end of December or early January.  2. Essential hypertension Riley Hurley is working on healthy weight loss and exercise to improve blood pressure control. We will watch for signs of hypotension as he continues his lifestyle modifications. Riley Hurley agreed to decrease losartan to 50 mg daily with no refills. We  will follow up on his blood pressure at his next appointment.  - losartan (COZAAR) 50 MG tablet; Take 1 tablet (50 mg total) by mouth daily.  Dispense: 30 tablet; Refill: 0  3. Vitamin D deficiency Low Vitamin D level contributes to fatigue and are associated with obesity, breast, and colon cancer. Riley Hurley agreed to continue taking Vit D, and he is to reach out to let a provider know what units of Vit D he is taking. He will follow-up for routine testing of Vitamin D, at least 2-3 times per year to avoid over-replacement.  4. At risk for activity intolerance Riley Hurley was given approximately 15 minutes of exercise intolerance counseling today. He is 54 y.o. male and has risk factors exercise intolerance including obesity. We discussed intensive lifestyle modifications today with an emphasis on specific weight loss instructions and strategies. Riley Hurley will slowly increase activity as tolerated.  Repetitive spaced learning was employed today to elicit superior memory formation and behavioral change.  5. Class 3 severe obesity with serious comorbidity and body mass index (BMI) of 40.0 to 44.9 in adult, unspecified obesity type Riley Hurley) Riley Hurley is currently in the action stage of change. As such, his goal is to continue with weight loss efforts. He has agreed to the Category 3 Plan.   Exercise goals: As is.  Behavioral modification strategies: increasing lean protein intake, meal planning and cooking strategies, keeping healthy foods in the home and planning for success.  Riley Hurley has agreed to follow-up with our clinic  in 3 weeks. He was informed of the importance of frequent follow-up visits to maximize his success with intensive lifestyle modifications for his multiple health conditions.   Objective:   Blood pressure 104/71, pulse 97, temperature 98.8 F (37.1 C), temperature source Oral, height 5\' 11"  (1.803 m), weight (!) 311 lb (141.1 kg), SpO2 96 %. Body mass index is 43.38 kg/m.  General:  Cooperative, alert, well developed, in no acute distress. HEENT: Conjunctivae and lids unremarkable. Cardiovascular: Regular rhythm.  Lungs: Normal work of breathing. Neurologic: No focal deficits.   Lab Results  Component Value Date   CREATININE 1.13 12/27/2019   BUN 27 (H) 12/27/2019   NA 143 12/27/2019   K 4.2 12/27/2019   CL 104 12/27/2019   CO2 25 12/27/2019   Lab Results  Component Value Date   ALT 19 12/27/2019   AST 19 12/27/2019   ALKPHOS 63 12/27/2019   BILITOT 0.3 12/27/2019   Lab Results  Component Value Date   HGBA1C 6.2 (H) 12/27/2019   Lab Results  Component Value Date   INSULIN 20.6 12/27/2019   Lab Results  Component Value Date   TSH 1.610 12/27/2019   Lab Results  Component Value Date   CHOL 132 12/27/2019   HDL 34 (L) 12/27/2019   LDLCALC 77 12/27/2019   TRIG 112 12/27/2019   Lab Results  Component Value Date   WBC 7.4 12/27/2019   HGB 14.2 12/27/2019   HCT 45.7 12/27/2019   MCV 85 12/27/2019   PLT 277 12/27/2019   No results found for: IRON, TIBC, FERRITIN  Attestation Statements:   Reviewed by clinician on day of visit: allergies, medications, problem list, medical history, surgical history, family history, social history, and previous encounter notes.   I, 12/29/2019, am acting as transcriptionist for Burt Knack, MD.  I have reviewed the above documentation for accuracy and completeness, and I agree with the above. - Reuben Likes, MD

## 2020-03-25 ENCOUNTER — Encounter (INDEPENDENT_AMBULATORY_CARE_PROVIDER_SITE_OTHER): Payer: Self-pay | Admitting: Family Medicine

## 2020-03-25 ENCOUNTER — Ambulatory Visit (INDEPENDENT_AMBULATORY_CARE_PROVIDER_SITE_OTHER): Payer: 59 | Admitting: Family Medicine

## 2020-03-25 ENCOUNTER — Other Ambulatory Visit: Payer: Self-pay

## 2020-03-25 VITALS — BP 117/75 | HR 105 | Temp 98.7°F | Ht 71.0 in | Wt 311.0 lb

## 2020-03-25 DIAGNOSIS — Z6841 Body Mass Index (BMI) 40.0 and over, adult: Secondary | ICD-10-CM

## 2020-03-25 DIAGNOSIS — E559 Vitamin D deficiency, unspecified: Secondary | ICD-10-CM

## 2020-03-25 DIAGNOSIS — I10 Essential (primary) hypertension: Secondary | ICD-10-CM | POA: Diagnosis not present

## 2020-03-25 NOTE — Progress Notes (Signed)
Chief Complaint:   OBESITY Riley Hurley is here to discuss his progress with his obesity treatment plan along with follow-up of his obesity related diagnoses. Riley Hurley is on the Category 3 Plan and states he is following his eating plan approximately 70% of the time. Riley Hurley states he is lifting weights for 30-45 minutes 3 times per week, and walking for 1 hour 1 time per week.  Today's visit was #: 5 Starting weight: 335 lbs Starting date: 12/27/2019 Today's weight: 311 lbs Today's date: 03/25/2020 Total lbs lost to date: 24 Total lbs lost since last in-office visit: 0  Interim History: Riley Hurley had a sore throat after his last appointment and he drank more juice and tea. He notes exercise has been less in the past few weeks. He is planning on taking some time off in the next few weeks, and planning on doing some Christmas activities in the next few weeks. When he wasn't feeling well he wasn't eating much. He has stopped all soda drinking.  Subjective:   1. Essential hypertension Eudell's blood pressure is well controlled today. He denies chest pain, chest pressure, or headache. He is on losartan and hydrochlorothiazide.  2. Vitamin D deficiency Riley Hurley is on OTC Vit D, and he notes fatigue.  Assessment/Plan:   1. Essential hypertension Riley Hurley will continue his current medications, no change in dose. He will continue working on healthy weight loss and exercise to improve blood pressure control. We will watch for signs of hypotension as he continues his lifestyle modifications.  2. Vitamin D deficiency Low Vitamin D level contributes to fatigue and are associated with obesity, breast, and colon cancer. Riley Hurley agreed to continue taking OTC Vitamin D and we will repeat labs in January or February. He will follow-up for routine testing of Vitamin D, at least 2-3 times per year to avoid over-replacement.  3. Class 3 severe obesity with serious comorbidity and body mass index (BMI) of 40.0 to 44.9 in  adult, unspecified obesity type [facility]) Riley Hurley is currently in the action stage of change. As such, his goal is to continue with weight loss efforts. He has agreed to the Category 3 Plan.   Exercise goals: As is.  Behavioral modification strategies: increasing lean protein intake, meal planning and cooking strategies, keeping healthy foods in the home and planning for success.  Riley Hurley has agreed to follow-up with our clinic in 3 weeks. He was informed of the importance of frequent follow-up visits to maximize his success with intensive lifestyle modifications for his multiple health conditions.   Objective:   Blood pressure 117/75, pulse (!) 105, temperature 98.7 F (37.1 C), temperature source Oral, height 5\' 11"  (1.803 m), weight (!) 311 lb (141.1 kg), SpO2 94 %. Body mass index is 43.38 kg/m.  General: Cooperative, alert, well developed, in no acute distress. HEENT: Conjunctivae and lids unremarkable. Cardiovascular: Regular rhythm.  Lungs: Normal work of breathing. Neurologic: No focal deficits.   Lab Results  Component Value Date   CREATININE 1.13 12/27/2019   BUN 27 (H) 12/27/2019   NA 143 12/27/2019   K 4.2 12/27/2019   CL 104 12/27/2019   CO2 25 12/27/2019   Lab Results  Component Value Date   ALT 19 12/27/2019   AST 19 12/27/2019   ALKPHOS 63 12/27/2019   BILITOT 0.3 12/27/2019   Lab Results  Component Value Date   HGBA1C 6.2 (H) 12/27/2019   Lab Results  Component Value Date   INSULIN 20.6 12/27/2019   Lab Results  Component Value Date   TSH 1.610 12/27/2019   Lab Results  Component Value Date   CHOL 132 12/27/2019   HDL 34 (L) 12/27/2019   LDLCALC 77 12/27/2019   TRIG 112 12/27/2019   Lab Results  Component Value Date   WBC 7.4 12/27/2019   HGB 14.2 12/27/2019   HCT 45.7 12/27/2019   MCV 85 12/27/2019   PLT 277 12/27/2019   No results found for: IRON, TIBC, FERRITIN  Attestation Statements:   Reviewed by clinician on day of visit: allergies,  medications, problem list, medical history, surgical history, family history, social history, and previous encounter notes.  Time spent on visit including pre-visit chart review and post-visit care and charting was 16 minutes.    I, Burt Knack, am acting as transcriptionist for Reuben Likes, MD. I have reviewed the above documentation for accuracy and completeness, and I agree with the above. - Katherina Mires, MD

## 2020-03-27 ENCOUNTER — Other Ambulatory Visit (INDEPENDENT_AMBULATORY_CARE_PROVIDER_SITE_OTHER): Payer: Self-pay | Admitting: Family Medicine

## 2020-03-27 DIAGNOSIS — I10 Essential (primary) hypertension: Secondary | ICD-10-CM

## 2020-04-01 NOTE — Telephone Encounter (Signed)
This patient was last seen by Dr. Ukleja, and currently has an upcoming appt scheduled on 04/17/20 with her.  

## 2020-04-02 ENCOUNTER — Encounter (INDEPENDENT_AMBULATORY_CARE_PROVIDER_SITE_OTHER): Payer: Self-pay

## 2020-04-02 NOTE — Telephone Encounter (Signed)
MyChart message sent to pt to find out if they have enough medication to get them through until next appt.   

## 2020-04-17 ENCOUNTER — Ambulatory Visit (INDEPENDENT_AMBULATORY_CARE_PROVIDER_SITE_OTHER): Payer: 59 | Admitting: Family Medicine

## 2020-04-17 ENCOUNTER — Encounter (INDEPENDENT_AMBULATORY_CARE_PROVIDER_SITE_OTHER): Payer: Self-pay | Admitting: Family Medicine

## 2020-04-17 ENCOUNTER — Other Ambulatory Visit: Payer: Self-pay

## 2020-04-17 VITALS — BP 105/72 | HR 93 | Temp 98.8°F | Ht 71.0 in | Wt 307.0 lb

## 2020-04-17 DIAGNOSIS — R7303 Prediabetes: Secondary | ICD-10-CM

## 2020-04-17 DIAGNOSIS — Z9189 Other specified personal risk factors, not elsewhere classified: Secondary | ICD-10-CM

## 2020-04-17 DIAGNOSIS — Z6841 Body Mass Index (BMI) 40.0 and over, adult: Secondary | ICD-10-CM

## 2020-04-17 DIAGNOSIS — I10 Essential (primary) hypertension: Secondary | ICD-10-CM

## 2020-04-17 MED ORDER — LOSARTAN POTASSIUM 25 MG PO TABS: 25.0000 mg | ORAL_TABLET | Freq: Every day | ORAL | 0 refills | Status: DC

## 2020-04-22 NOTE — Progress Notes (Signed)
Chief Complaint:   OBESITY Riley Hurley is here to discuss his progress with his obesity treatment plan along with follow-up of his obesity related diagnoses. Riley Hurley is on the Category 3 Plan and states he is following his eating plan approximately 70% of the time. Riley Hurley states he is walking, dancing, and doing weights for 30 minutes 3 times per week.  Today's visit was #: 6 Starting weight: 335 lbs Starting date: 12/27/2019 Today's weight: 307 lbs Today's date: 04/17/2020 Total lbs lost to date: 28 Total lbs lost since last in-office visit: 4  Interim History: Riley Hurley had a family filled Thanksgiving but he is planning for a quiet Christmas. He is working toward getting all of the food in with his busy schedule. He had a few drinks at a Christmas party and pasta when he got home.  Subjective:   1. Essential hypertension Riley Hurley's blood pressure is low today. He reports his blood pressure at home is in the 110's systolically. He is on losartan and hydrochlorothiazide. He denies chest pain, chest pressure, or headache.  2. Pre-diabetes Riley Hurley's last A1c was 6.2 and insulin 20.6. He is not on any medications. He reports a few carbohydrate cravings for ETOH and pasta.  3. At risk for heart disease Riley Hurley is at a higher than average risk for cardiovascular disease due to obesity.   Assessment/Plan:   1. Essential hypertension Riley Hurley is working on healthy weight loss and exercise to improve blood pressure control. We will watch for signs of hypotension as he continues his lifestyle modifications. Riley Hurley agreed to decrease losartan to 25 mg PO daily, and we will refill for 1 month. We will follow up on his blood pressure at his next appointment.  - losartan (COZAAR) 25 MG tablet; Take 1 tablet (25 mg total) by mouth daily.  Dispense: 30 tablet; Refill: 0  2. Pre-diabetes Riley Hurley will continue to work on weight loss, exercise, and decreasing simple carbohydrates to help decrease the risk of diabetes.  He will have labs done at his primary care physician's office in February.  3. At risk for heart disease Riley Hurley was given approximately 15 minutes of coronary artery disease prevention counseling today. He is 54 y.o. male and has risk factors for heart disease including obesity. We discussed intensive lifestyle modifications today with an emphasis on specific weight loss instructions and strategies.   Repetitive spaced learning was employed today to elicit superior memory formation and behavioral change.  4. Class 3 severe obesity with serious comorbidity and body mass index (BMI) of 40.0 to 44.9 in adult, unspecified obesity type [facility]) Riley Hurley is currently in the action stage of change. As such, his goal is to continue with weight loss efforts. He has agreed to the Category 3 Plan.   Exercise goals: As is.  Behavioral modification strategies: increasing lean protein intake, meal planning and cooking strategies, holiday eating strategies  and celebration eating strategies.  Emmanual has agreed to follow-up with our clinic in 4 weeks. He was informed of the importance of frequent follow-up visits to maximize his success with intensive lifestyle modifications for his multiple health conditions.   Objective:   Blood pressure 105/72, pulse 93, temperature 98.8 F (37.1 C), temperature source Oral, height 5\' 11"  (1.803 m), weight (!) 307 lb (139.3 kg), SpO2 100 %. Body mass index is 42.82 kg/m.  General: Cooperative, alert, well developed, in no acute distress. HEENT: Conjunctivae and lids unremarkable. Cardiovascular: Regular rhythm.  Lungs: Normal work of breathing. Neurologic: No focal deficits.  Lab Results  Component Value Date   CREATININE 1.13 12/27/2019   BUN 27 (H) 12/27/2019   NA 143 12/27/2019   K 4.2 12/27/2019   CL 104 12/27/2019   CO2 25 12/27/2019   Lab Results  Component Value Date   ALT 19 12/27/2019   AST 19 12/27/2019   ALKPHOS 63 12/27/2019   BILITOT 0.3  12/27/2019   Lab Results  Component Value Date   HGBA1C 6.2 (H) 12/27/2019   Lab Results  Component Value Date   INSULIN 20.6 12/27/2019   Lab Results  Component Value Date   TSH 1.610 12/27/2019   Lab Results  Component Value Date   CHOL 132 12/27/2019   HDL 34 (L) 12/27/2019   LDLCALC 77 12/27/2019   TRIG 112 12/27/2019   Lab Results  Component Value Date   WBC 7.4 12/27/2019   HGB 14.2 12/27/2019   HCT 45.7 12/27/2019   MCV 85 12/27/2019   PLT 277 12/27/2019   No results found for: IRON, TIBC, FERRITIN  Attestation Statements:   Reviewed by clinician on day of visit: allergies, medications, problem list, medical history, surgical history, family history, social history, and previous encounter notes.   I, Burt Knack, am acting as transcriptionist for Reuben Likes, MD.  I have reviewed the above documentation for accuracy and completeness, and I agree with the above. - Katherina Mires, MD

## 2020-05-01 ENCOUNTER — Other Ambulatory Visit (INDEPENDENT_AMBULATORY_CARE_PROVIDER_SITE_OTHER): Payer: Self-pay | Admitting: Family Medicine

## 2020-05-01 DIAGNOSIS — I10 Essential (primary) hypertension: Secondary | ICD-10-CM

## 2020-05-07 ENCOUNTER — Ambulatory Visit (INDEPENDENT_AMBULATORY_CARE_PROVIDER_SITE_OTHER): Payer: 59 | Admitting: Family Medicine

## 2020-05-15 ENCOUNTER — Encounter (INDEPENDENT_AMBULATORY_CARE_PROVIDER_SITE_OTHER): Payer: Self-pay

## 2020-05-16 ENCOUNTER — Telehealth (INDEPENDENT_AMBULATORY_CARE_PROVIDER_SITE_OTHER): Payer: Self-pay

## 2020-05-16 ENCOUNTER — Other Ambulatory Visit: Payer: Self-pay

## 2020-05-16 ENCOUNTER — Encounter (INDEPENDENT_AMBULATORY_CARE_PROVIDER_SITE_OTHER): Payer: Self-pay | Admitting: Family Medicine

## 2020-05-16 ENCOUNTER — Telehealth (INDEPENDENT_AMBULATORY_CARE_PROVIDER_SITE_OTHER): Payer: 59 | Admitting: Family Medicine

## 2020-05-16 DIAGNOSIS — I10 Essential (primary) hypertension: Secondary | ICD-10-CM

## 2020-05-16 DIAGNOSIS — Z6841 Body Mass Index (BMI) 40.0 and over, adult: Secondary | ICD-10-CM | POA: Diagnosis not present

## 2020-05-16 DIAGNOSIS — E559 Vitamin D deficiency, unspecified: Secondary | ICD-10-CM

## 2020-05-16 NOTE — Telephone Encounter (Signed)
Please review

## 2020-05-16 NOTE — Telephone Encounter (Signed)
I connected with  Riley Hurley on 05/16/20 by a video enabled telemedicine application and verified that I am speaking with the correct person using two identifiers.   I discussed the limitations of evaluation and management by telemedicine. The patient expressed understanding and agreed to proceed.

## 2020-05-22 NOTE — Progress Notes (Signed)
TeleHealth Visit:  Due to the COVID-19 pandemic, this visit was completed with telemedicine (audio/video) technology to reduce patient and provider exposure as well as to preserve personal protective equipment.   Riley Hurley has verbally consented to this TeleHealth visit. The patient is located at home, the provider is located at the [facility] office. The participants in this visit include the listed provider and patient. The visit was conducted today via MyChart video.  Chief Complaint: OBESITY Riley Hurley is here to discuss his progress with his obesity treatment plan along with follow-up of his obesity related diagnoses. Riley Hurley is on the Category 3 Plan and states he is following his eating plan approximately 80% of the time. Riley Hurley states he is walking 2 times per week and lifting weights for 20 minutes 2 times per week.  Today's visit was #: 7 Starting weight: 335 lbs Starting date: 12/27/2019  Interim History: Riley Hurley reports a weight of 298 pounds this morning.  Over the holidays, he did mostly protein-rich foods and got in ~160 grams of protein/day.  He finds sticking to trying to eat every 3 hours to be difficult given how busy his schedule is.  He reports occasional plateaus with weight loss if he is not as active.  Subjective:   1. Essential hypertension Review: taking medications as instructed, no medication side effects noted, no chest pain on exertion, no dyspnea on exertion, no swelling of ankles.  Blood pressure is well-controlled at home (113/73).  He is on losartan-HCTZ.  BP Readings from Last 3 Encounters:  04/17/20 105/72  03/25/20 117/75  03/04/20 104/71   2. Vitamin D deficiency Riley Hurley Vitamin D level was 15.6 on 12/27/2019. He is currently taking OTC vitamin D. He denies nausea, vomiting or muscle weakness.    Assessment/Plan:   1. Essential hypertension Riley Hurley is working on healthy weight loss and exercise to improve blood pressure control. We will watch  for signs of hypotension as he continues his lifestyle modifications.  Continue blood pressure medications.  No change in dose.  2. Vitamin D deficiency Low Vitamin D level contributes to fatigue and are associated with obesity, breast, and colon cancer.  Will repeat labs at next appointment (if lab not checked by PCP).  3. Class 3 severe obesity with serious comorbidity and body mass index (BMI) of 40.0 to 44.9 in adult, unspecified obesity type Riley Hurley)  Riley Hurley is currently in the action stage of change. As such, his goal is to continue with weight loss efforts. He has agreed to keeping a food journal and adhering to recommended goals of 1500 calories and 125-160 grams of protein daily.   Exercise goals: All adults should avoid inactivity. Some physical activity is better than none, and adults who participate in any amount of physical activity gain some health benefits.  Behavioral modification strategies: increasing lean protein intake, meal planning and cooking strategies, keeping healthy foods in the home, planning for success and keeping a strict food journal.  Riley Hurley has agreed to follow-up with our clinic in 4 weeks.  Labs at next appointment.  He was informed of the importance of frequent follow-up visits to maximize his success with intensive lifestyle modifications for his multiple health conditions.  Objective:   VITALS: Per patient if applicable, see vitals. GENERAL: Alert and in no acute distress. CARDIOPULMONARY: No increased WOB. Speaking in clear sentences.  PSYCH: Pleasant and cooperative. Speech normal rate and rhythm. Affect is appropriate. Insight and judgement are appropriate. Attention is focused, linear, and appropriate.  NEURO: Oriented as arrived to appointment on time with no prompting.   Lab Results  Component Value Date   CREATININE 1.13 12/27/2019   BUN 27 (H) 12/27/2019   NA 143 12/27/2019   K 4.2 12/27/2019   CL 104 12/27/2019   CO2 25 12/27/2019   Lab  Results  Component Value Date   ALT 19 12/27/2019   AST 19 12/27/2019   ALKPHOS 63 12/27/2019   BILITOT 0.3 12/27/2019   Lab Results  Component Value Date   HGBA1C 6.2 (H) 12/27/2019   Lab Results  Component Value Date   INSULIN 20.6 12/27/2019   Lab Results  Component Value Date   TSH 1.610 12/27/2019   Lab Results  Component Value Date   CHOL 132 12/27/2019   HDL 34 (L) 12/27/2019   LDLCALC 77 12/27/2019   TRIG 112 12/27/2019   Lab Results  Component Value Date   WBC 7.4 12/27/2019   HGB 14.2 12/27/2019   HCT 45.7 12/27/2019   MCV 85 12/27/2019   PLT 277 12/27/2019   Attestation Statements:   Reviewed by clinician on day of visit: allergies, medications, problem list, medical history, surgical history, family history, social history, and previous encounter notes.  I, Insurance claims handler, CMA, am acting as transcriptionist for Reuben Likes, MD.  I have reviewed the above documentation for accuracy and completeness, and I agree with the above. - Katherina Mires, MD

## 2020-05-24 ENCOUNTER — Other Ambulatory Visit (INDEPENDENT_AMBULATORY_CARE_PROVIDER_SITE_OTHER): Payer: Self-pay | Admitting: Family Medicine

## 2020-05-24 DIAGNOSIS — I10 Essential (primary) hypertension: Secondary | ICD-10-CM

## 2020-05-27 MED ORDER — LOSARTAN POTASSIUM 25 MG PO TABS: 25.0000 mg | ORAL_TABLET | Freq: Every day | ORAL | 0 refills | Status: DC

## 2020-05-27 NOTE — Telephone Encounter (Signed)
Dr. U

## 2020-06-20 ENCOUNTER — Other Ambulatory Visit (INDEPENDENT_AMBULATORY_CARE_PROVIDER_SITE_OTHER): Payer: Self-pay | Admitting: Family Medicine

## 2020-06-20 ENCOUNTER — Encounter (INDEPENDENT_AMBULATORY_CARE_PROVIDER_SITE_OTHER): Payer: Self-pay

## 2020-06-20 DIAGNOSIS — I10 Essential (primary) hypertension: Secondary | ICD-10-CM

## 2020-06-20 NOTE — Telephone Encounter (Signed)
Message sent to pt-CAS 

## 2020-06-25 ENCOUNTER — Other Ambulatory Visit: Payer: Self-pay

## 2020-06-25 ENCOUNTER — Ambulatory Visit (INDEPENDENT_AMBULATORY_CARE_PROVIDER_SITE_OTHER): Payer: 59 | Admitting: Family Medicine

## 2020-06-25 ENCOUNTER — Encounter (INDEPENDENT_AMBULATORY_CARE_PROVIDER_SITE_OTHER): Payer: Self-pay | Admitting: Family Medicine

## 2020-06-25 VITALS — BP 104/72 | HR 72 | Temp 98.3°F | Ht 71.0 in | Wt 299.0 lb

## 2020-06-25 DIAGNOSIS — R7303 Prediabetes: Secondary | ICD-10-CM

## 2020-06-25 DIAGNOSIS — I10 Essential (primary) hypertension: Secondary | ICD-10-CM | POA: Diagnosis not present

## 2020-06-25 DIAGNOSIS — Z6841 Body Mass Index (BMI) 40.0 and over, adult: Secondary | ICD-10-CM | POA: Diagnosis not present

## 2020-06-25 DIAGNOSIS — Z9189 Other specified personal risk factors, not elsewhere classified: Secondary | ICD-10-CM

## 2020-06-25 MED ORDER — LOSARTAN POTASSIUM 25 MG PO TABS: 12.5000 mg | ORAL_TABLET | Freq: Every day | ORAL | 0 refills | Status: DC

## 2020-06-26 NOTE — Progress Notes (Signed)
Chief Complaint:   OBESITY Riley Hurley is here to discuss his progress with his obesity treatment plan along with follow-up of his obesity related diagnoses. Ketrick is on the Category 3 Plan and states he is following his eating plan approximately 20% of the time. Chaise states he is doing 0 minutes 0 times per week.  Today's visit was #: 8 Starting weight: 335 lbs Starting date: 12/27/2019 Today's weight: 299 lbs Today's date: 06/25/2020 Total lbs lost to date: 36 lbs Total lbs lost since last in-office visit: 8 lbs  Interim History: Riley Hurley is feeling frustrated with lack of weight loss since his video conference on 05/16/2020. He voices he lost weight prior to that , then struggled to see scale changes. Some days he is getting all food in and other days he is not, and still others getting more than calorie allotment. He was busy over the last 4 weeks so he didn't get all food in.  Subjective:   1. Pre-diabetes Riley Hurley's A1c improved to 5.7 with an insulin level of 10 on recent labs. He is not on medication.  2. Essential hypertension Riley Hurley's BP is controlled today.  Pt denies chest pain, chest pressure and headache. He is on losartan 25 mg.  BP Readings from Last 3 Encounters:  06/25/20 104/72  04/17/20 105/72  03/25/20 117/75   3. At risk for heart disease Riley Hurley is at a higher than average risk for cardiovascular disease due to obesity.   Assessment/Plan:   1. Pre-diabetes Riley Hurley will continue to work on weight loss, exercise, and decreasing simple carbohydrates to help decrease the risk of diabetes. Repeat labs in 4 months.  2. Essential hypertension Riley Hurley is working on healthy weight loss and exercise to improve blood pressure control. We will watch for signs of hypotension as he continues his lifestyle modifications. Cut losartan in half for the next few weeks and follow up wit BP at next appointment.  - losartan (COZAAR) 25 MG tablet; Take 0.5 tablets (12.5 mg total) by mouth  daily.  Dispense: 30 tablet; Refill: 0  3. At risk for heart disease Riley Hurley was given approximately 15 minutes of coronary artery disease prevention counseling today. He is 55 y.o. male and has risk factors for heart disease including obesity. We discussed intensive lifestyle modifications today with an emphasis on specific weight loss instructions and strategies.   Repetitive spaced learning was employed today to elicit superior memory formation and behavioral change.  4. Class 3 severe obesity with serious comorbidity and body mass index (BMI) of 40.0 to 44.9 in adult, unspecified obesity type [facility]) Riley Hurley is currently in the action stage of change. As such, his goal is to continue with weight loss efforts. He has agreed to the Category 4 Plan and keeping a food journal and adhering to recommended goals of 1700-1900 calories and 125+ g protein.   Exercise goals: No exercise has been prescribed at this time.  Behavioral modification strategies: increasing lean protein intake, meal planning and cooking strategies, keeping healthy foods in the home and planning for success.  Riley Hurley has agreed to follow-up with our clinic in 3 weeks. He was informed of the importance of frequent follow-up visits to maximize his success with intensive lifestyle modifications for his multiple health conditions.   Objective:   Blood pressure 104/72, pulse 72, temperature 98.3 F (36.8 C), temperature source Oral, height 5\' 11"  (1.803 m), weight 299 lb (135.6 kg), SpO2 99 %. Body mass index is 41.7 kg/m.  General: Cooperative,  alert, well developed, in no acute distress. HEENT: Conjunctivae and lids unremarkable. Cardiovascular: Regular rhythm.  Lungs: Normal work of breathing. Neurologic: No focal deficits.   Lab Results  Component Value Date   CREATININE 1.13 12/27/2019   BUN 27 (H) 12/27/2019   NA 143 12/27/2019   K 4.2 12/27/2019   CL 104 12/27/2019   CO2 25 12/27/2019   Lab Results  Component Value  Date   ALT 19 12/27/2019   AST 19 12/27/2019   ALKPHOS 63 12/27/2019   BILITOT 0.3 12/27/2019   Lab Results  Component Value Date   HGBA1C 6.2 (H) 12/27/2019   Lab Results  Component Value Date   INSULIN 20.6 12/27/2019   Lab Results  Component Value Date   TSH 1.610 12/27/2019   Lab Results  Component Value Date   CHOL 132 12/27/2019   HDL 34 (L) 12/27/2019   LDLCALC 77 12/27/2019   TRIG 112 12/27/2019   Lab Results  Component Value Date   WBC 7.4 12/27/2019   HGB 14.2 12/27/2019   HCT 45.7 12/27/2019   MCV 85 12/27/2019   PLT 277 12/27/2019    Attestation Statements:   Reviewed by clinician on day of visit: allergies, medications, problem list, medical history, surgical history, family history, social history, and previous encounter notes.  Edmund Hilda, am acting as transcriptionist for Reuben Likes, MD.   I have reviewed the above documentation for accuracy and completeness, and I agree with the above. - Katherina Mires, MD

## 2020-07-18 ENCOUNTER — Encounter (INDEPENDENT_AMBULATORY_CARE_PROVIDER_SITE_OTHER): Payer: Self-pay | Admitting: Family Medicine

## 2020-07-18 ENCOUNTER — Ambulatory Visit (INDEPENDENT_AMBULATORY_CARE_PROVIDER_SITE_OTHER): Payer: 59 | Admitting: Family Medicine

## 2020-07-18 ENCOUNTER — Other Ambulatory Visit: Payer: Self-pay

## 2020-07-18 VITALS — BP 106/73 | HR 89 | Temp 98.7°F | Ht 71.0 in | Wt 298.0 lb

## 2020-07-18 DIAGNOSIS — Z6841 Body Mass Index (BMI) 40.0 and over, adult: Secondary | ICD-10-CM | POA: Diagnosis not present

## 2020-07-18 DIAGNOSIS — R7303 Prediabetes: Secondary | ICD-10-CM | POA: Diagnosis not present

## 2020-07-18 DIAGNOSIS — Z9189 Other specified personal risk factors, not elsewhere classified: Secondary | ICD-10-CM | POA: Diagnosis not present

## 2020-07-18 DIAGNOSIS — I10 Essential (primary) hypertension: Secondary | ICD-10-CM | POA: Diagnosis not present

## 2020-07-22 NOTE — Progress Notes (Signed)
Chief Complaint:   OBESITY Riley Hurley is here to discuss his progress with his obesity treatment plan along with follow-up of his obesity related diagnoses. Riley Hurley is on the Category 4 Plan and states he is following his eating plan approximately 89% of the time. Riley Hurley states he is lifting weights 20-30 minutes 8 times since his last visit.  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 He voices that he is getting all food in and doesn't like eating too much food at the end of the day if he has missed too much. He is snacking throughout the day. He is on ly hungry if he skips meal portion. He is doing 3-4 eggs + protein shake in the morning and then fiber supplement; Lunch is eggs or sandwich from [facility].  Subjective:   1. Pre-diabetes Riley Hurley's last A1c was 6.2 with an insulin level of 20.6. He is not on medication. Pt did send labs from recent draw.  Lab Results  Component Value Date   HGBA1C 6.2 (H) 12/27/2019   Lab Results  Component Value Date   INSULIN 20.6 12/27/2019    2. Essential hypertension Riley Hurley's BP [place] well controlled today. He is on losartan and HCTZ. He denies chest pain, chest pressure and headache.  BP Readings from Last 3 Encounters:  07/18/20 106/73  06/25/20 104/72  04/17/20 105/72    3. At risk for complication associated with hypotension Riley Hurley is at risk for complication associated with hypotension due to low BP with 2 meds.  Assessment/Plan:   1. Pre-diabetes Riley Hurley will continue to work on weight loss, exercise, and decreasing simple carbohydrates to help decrease the risk of diabetes. Medication option discussed today. He will think about medication options.  2. Essential  hypertension Riley Hurley is working on healthy weight loss and exercise to improve blood pressure control. We will watch for signs of hypotension as he continues his lifestyle modifications. Stop losartan and follow up BP at next OV.  3. At risk for complication associated with hypotension Riley Hurley was given approximately 15 minutes of education and counseling today to help avoid hypotension. We discussed risks of hypotension with weight loss and signs of hypotension such as feeling lightheaded or unsteady.  Repetitive spaced learning was employed today to elicit superior memory formation and behavioral change.  4. Class 3 severe obesity with serious comorbidity and body mass index (BMI) of 40.0 to 44.9 in adult, unspecified obesity type [facility]) Riley Hurley is currently in the action stage of change. As such, his goal is to continue with weight loss efforts. He has agreed to keeping a food journal and adhering to recommended goals of 2100-2200 calories and 125+ g protein.   Exercise goals: As is  Behavioral modification strategies: increasing lean protein intake, meal planning and cooking strategies, keeping healthy foods in the home, avoiding temptations and planning for success.  Riley Hurley has agreed to follow-up with our clinic in 4 weeks. He was informed of the importance of frequent follow-up visits to maximize his success with intensive lifestyle modifications for his multiple health conditions.   Objective:   Blood pressure 106/73, pulse 89, temperature 98.7 F (37.1 C), temperature source Oral, height 5\' 11"  (1.803 m), weight 298 lb (135.2 kg), SpO2 97 %.  Body mass index is 41.56 kg/m.  General: Cooperative, alert, well developed, in no acute distress. HEENT: Conjunctivae and lids unremarkable. Cardiovascular: Regular rhythm.  Lungs: Normal work of breathing. Neurologic: No focal deficits.   Lab Results  Component Value Date   CREATININE 1.13 12/27/2019   BUN 27 (H) 12/27/2019   NA 143  12/27/2019   K 4.2 12/27/2019   CL 104 12/27/2019   CO2 25 12/27/2019   Lab Results  Component Value Date   ALT 19 12/27/2019   AST 19 12/27/2019   ALKPHOS 63 12/27/2019   BILITOT 0.3 12/27/2019   Lab Results  Component Value Date   HGBA1C 6.2 (H) 12/27/2019   Lab Results  Component Value Date   INSULIN 20.6 12/27/2019   Lab Results  Component Value Date   TSH 1.610 12/27/2019   Lab Results  Component Value Date   CHOL 132 12/27/2019   HDL 34 (L) 12/27/2019   LDLCALC 77 12/27/2019   TRIG 112 12/27/2019   Lab Results  Component Value Date   WBC 7.4 12/27/2019   HGB 14.2 12/27/2019   HCT 45.7 12/27/2019   MCV 85 12/27/2019   PLT 277 12/27/2019     Attestation Statements:   Reviewed by clinician on day of visit: allergies, medications, problem list, medical history, surgical history, family history, social history, and previous encounter notes.  Edmund Hilda, am acting as transcriptionist for Reuben Likes, MD.   I have reviewed the above documentation for accuracy and completeness, and I agree with the above. - Katherina Mires, MD

## 2020-08-19 ENCOUNTER — Encounter (INDEPENDENT_AMBULATORY_CARE_PROVIDER_SITE_OTHER): Payer: Self-pay

## 2020-08-19 ENCOUNTER — Ambulatory Visit (INDEPENDENT_AMBULATORY_CARE_PROVIDER_SITE_OTHER): Payer: 59 | Admitting: Family Medicine

## 2020-09-09 ENCOUNTER — Ambulatory Visit (INDEPENDENT_AMBULATORY_CARE_PROVIDER_SITE_OTHER): Payer: 59 | Admitting: Family Medicine

## 2020-09-17 ENCOUNTER — Other Ambulatory Visit (INDEPENDENT_AMBULATORY_CARE_PROVIDER_SITE_OTHER): Payer: Self-pay | Admitting: Family Medicine

## 2020-09-17 DIAGNOSIS — I10 Essential (primary) hypertension: Secondary | ICD-10-CM

## 2020-09-17 NOTE — Telephone Encounter (Signed)
Dr.Ukleja 

## 2021-12-10 ENCOUNTER — Encounter (INDEPENDENT_AMBULATORY_CARE_PROVIDER_SITE_OTHER): Payer: Self-pay

## 2022-03-29 ENCOUNTER — Other Ambulatory Visit: Payer: Self-pay

## 2022-03-29 ENCOUNTER — Emergency Department (HOSPITAL_COMMUNITY): Payer: 59

## 2022-03-29 ENCOUNTER — Emergency Department (HOSPITAL_COMMUNITY)
Admission: EM | Admit: 2022-03-29 | Discharge: 2022-03-29 | Disposition: A | Discharge status: Home / Self Care | Payer: 59 | Attending: Emergency Medicine | Admitting: Emergency Medicine

## 2022-03-29 ENCOUNTER — Encounter (HOSPITAL_COMMUNITY): Payer: Self-pay | Admitting: *Deleted

## 2022-03-29 DIAGNOSIS — M5412 Radiculopathy, cervical region: Secondary | ICD-10-CM | POA: Insufficient documentation

## 2022-03-29 DIAGNOSIS — M4802 Spinal stenosis, cervical region: Secondary | ICD-10-CM | POA: Insufficient documentation

## 2022-03-29 DIAGNOSIS — R42 Dizziness and giddiness: Secondary | ICD-10-CM | POA: Diagnosis not present

## 2022-03-29 DIAGNOSIS — R200 Anesthesia of skin: Secondary | ICD-10-CM | POA: Diagnosis present

## 2022-03-29 LAB — URINALYSIS, ROUTINE W REFLEX MICROSCOPIC
Bilirubin Urine: NEGATIVE
Glucose, UA: NEGATIVE mg/dL
Hgb urine dipstick: NEGATIVE
Ketones, ur: NEGATIVE mg/dL
Leukocytes,Ua: NEGATIVE
Nitrite: NEGATIVE
Protein, ur: NEGATIVE mg/dL
Specific Gravity, Urine: 1.019 (ref 1.005–1.030)
pH: 6 (ref 5.0–8.0)

## 2022-03-29 LAB — CBC WITH DIFFERENTIAL/PLATELET
Abs Immature Granulocytes: 0.02 10*3/uL (ref 0.00–0.07)
Basophils Absolute: 0.1 10*3/uL (ref 0.0–0.1)
Basophils Relative: 1 %
Eosinophils Absolute: 0.2 10*3/uL (ref 0.0–0.5)
Eosinophils Relative: 2 %
HCT: 43 % (ref 39.0–52.0)
Hemoglobin: 13.6 g/dL (ref 13.0–17.0)
Immature Granulocytes: 0 %
Lymphocytes Relative: 22 %
Lymphs Abs: 1.5 10*3/uL (ref 0.7–4.0)
MCH: 27 pg (ref 26.0–34.0)
MCHC: 31.6 g/dL (ref 30.0–36.0)
MCV: 85.5 fL (ref 80.0–100.0)
Monocytes Absolute: 0.7 10*3/uL (ref 0.1–1.0)
Monocytes Relative: 9 %
Neutro Abs: 4.6 10*3/uL (ref 1.7–7.7)
Neutrophils Relative %: 66 %
Platelets: 249 10*3/uL (ref 150–400)
RBC: 5.03 MIL/uL (ref 4.22–5.81)
RDW: 14.5 % (ref 11.5–15.5)
WBC: 7 10*3/uL (ref 4.0–10.5)
nRBC: 0 % (ref 0.0–0.2)

## 2022-03-29 LAB — BASIC METABOLIC PANEL
Anion gap: 7 (ref 5–15)
BUN: 27 mg/dL — ABNORMAL HIGH (ref 6–20)
CO2: 29 mmol/L (ref 22–32)
Calcium: 9 mg/dL (ref 8.9–10.3)
Chloride: 104 mmol/L (ref 98–111)
Creatinine, Ser: 1.12 mg/dL (ref 0.61–1.24)
GFR, Estimated: >60 mL/min (ref >60)
Glucose, Bld: 105 mg/dL — ABNORMAL HIGH (ref 70–99)
Potassium: 3.6 mmol/L (ref 3.5–5.1)
Sodium: 140 mmol/L (ref 135–145)

## 2022-03-29 MED ORDER — PREDNISONE 50 MG PO TABS
60.0000 mg | ORAL_TABLET | Freq: Once | ORAL | Status: AC
  Administered 2022-03-29: 60 mg via ORAL
  Filled 2022-03-29: qty 1

## 2022-03-29 MED ORDER — PREDNISONE 10 MG PO TABS: ORAL_TABLET | ORAL | 0 refills | Status: AC

## 2022-03-29 NOTE — Discharge Instructions (Addendum)
You have been prescribed a prednisone taper to help reduce inflammation in your neck.  Take the next dose of this medication tomorrow as you received a dose here today.  You would benefit from seeing a neuro surgeon for further evaluation of your symptoms as discussed.  Please call Dr. Jake Samples tomorrow to arrange an office visit with him.

## 2022-03-29 NOTE — ED Triage Notes (Signed)
Pt with dizziness an hour ago.  Pt with bilateral numbness to arms and chest since 1000 this morning. Hx of the same once before and pt states resolved in a minute.  Pt woke up at 0930.

## 2022-03-29 NOTE — ED Notes (Signed)
Pt states numbness has eased some and in his fingers currently and dizziness has improved as well.

## 2022-03-29 NOTE — ED Provider Notes (Signed)
[facility] EMERGENCY DEPARTMENT Provider Note   CSN: [id number] Arrival date & time: 03/29/22  1124     History  Chief Complaint  Patient presents with   Dizziness    Riley Hurley is a 56 y.o. male presenting for evaluation of bilateral arm numbness and tingling which started around 10 AM this morning and is resolving just upon arrival in the ED.  Is also associated with some vague lightheadedness.  He describes episodes of arm numbness which typically will start in his right hand, radiate up his arm through the shoulder chest and down the other arm to his fingertips.  He states he has this symptom infrequently but often enough for it to be bothersome.  He also states that it typically happens midmorning but not every morning.  He has a long drive as he works in [place] and he states frequently when he gets to work he will have an episode of this symptom which he has to "shake his arms" and it typically resolves after several minutes.  Today's episode lasted for more than an hour which concerned him.  It is currently resolved.  He denies pain with these episodes including chest pain or shortness of breath.  He has found no alleviators for symptoms.  He denies fevers or chills, headache, any focal chronic weakness or numbness, no neck pain injury or stiffness.  He has found no alleviators for symptoms other than as described above.  The history is provided by the patient.       Home Medications Prior to Admission medications   Medication Sig Start Date End Date Taking? Authorizing Provider  predniSONE (DELTASONE) 10 MG tablet 6, 5, 4, 3, 2 then 1 tablet by mouth daily for 6 days total. 03/29/22  Yes Kamila Broda, Raynelle Fanning, PA-C  hydrochlorothiazide (HYDRODIURIL) 25 MG tablet Take 25 mg by mouth daily.    [provider]      Allergies    Patient has no known allergies.    Review of Systems   Review of Systems  Constitutional:  Negative for chills and fever.  HENT:  Negative for  congestion and sore throat.   Eyes: Negative.   Respiratory:  Negative for chest tightness and shortness of breath.   Cardiovascular:  Negative for chest pain and palpitations.  Gastrointestinal:  Negative for abdominal pain and nausea.  Genitourinary: Negative.   Musculoskeletal:  Negative for arthralgias, joint swelling and neck pain.  Skin: Negative.  Negative for rash and wound.  Neurological:  Positive for numbness. Negative for dizziness, weakness and headaches.  Psychiatric/Behavioral: Negative.    All other systems reviewed and are negative.   Physical Exam Updated Vital Signs BP (!) 118/99 (BP Location: Right Arm)   Pulse 70   Temp 97.8 F (36.6 C) (Oral)   Resp 18   Ht 6' (1.829 m)   Wt (!) 145.3 kg   SpO2 98%   BMI 43.44 kg/m  Physical Exam Vitals and nursing note reviewed.  Constitutional:      Appearance: He is well-developed.  HENT:     Head: Normocephalic and atraumatic.     Right Ear: Tympanic membrane normal.     Left Ear: Tympanic membrane normal.  Eyes:     Extraocular Movements: Extraocular movements intact.     Conjunctiva/sclera: Conjunctivae normal.     Pupils: Pupils are equal, round, and reactive to light.  Cardiovascular:     Rate and Rhythm: Normal rate and regular rhythm.     Heart sounds:  Normal heart sounds.  Pulmonary:     Effort: Pulmonary effort is normal.     Breath sounds: Normal breath sounds. No wheezing.  Abdominal:     General: Bowel sounds are normal.     Palpations: Abdomen is soft.     Tenderness: There is no abdominal tenderness.  Musculoskeletal:        General: Normal range of motion.     Cervical back: Normal range of motion and neck supple.  Lymphadenopathy:     Cervical: No cervical adenopathy.  Skin:    General: Skin is warm and dry.     Findings: No rash.  Neurological:     General: No focal deficit present.     Mental Status: He is alert and oriented to person, place, and time.     GCS: GCS eye subscore is 4.  GCS verbal subscore is 5. GCS motor subscore is 6.     Cranial Nerves: No cranial nerve deficit.     Sensory: No sensory deficit.     Coordination: Coordination normal.     Gait: Gait normal.     Deep Tendon Reflexes: Reflexes normal.     Comments: normal rapid alternating movements. Cranial nerves III-XII intact.  No pronator drift. Equal grip strength.  Gait normal.   Psychiatric:        Speech: Speech normal.        Behavior: Behavior normal.        Thought Content: Thought content normal.     ED Results / Procedures / Treatments   Labs (all labs ordered are listed, but only abnormal results are displayed) Labs Reviewed  BASIC METABOLIC PANEL - Abnormal; Notable for the following components:      Result Value   Glucose, Bld 105 (*)    BUN 27 (*)    All other components within normal limits  CBC WITH DIFFERENTIAL/PLATELET  URINALYSIS, ROUTINE W REFLEX MICROSCOPIC    EKG None  Radiology CT Cervical Spine Wo Contrast  Result Date: 03/29/2022 CLINICAL DATA:  Bilateral arm numbness. EXAM: CT CERVICAL SPINE WITHOUT CONTRAST TECHNIQUE: Multidetector CT imaging of the cervical spine was performed without intravenous contrast. Multiplanar CT image reconstructions were also generated. RADIATION DOSE REDUCTION: This exam was performed according to the departmental dose-optimization program which includes automated exposure control, adjustment of the mA and/or kV according to patient size and/or use of iterative reconstruction technique. COMPARISON:  None Available. FINDINGS: Alignment: There is slight reversal of the normal curvature. There is no jumped or perched facet or other evidence of traumatic malalignment. Skull base and vertebrae: Skull base alignment is maintained. Vertebral body heights are preserved. There is no evidence of acute fracture. There is no suspicious osseous lesion. Soft tissues and spinal canal: No prevertebral fluid or swelling. No visible canal hematoma. Disc  levels: There is advanced disc space narrowing and degenerative endplate change throughout the cervical spine. C2-C3: There is mild uncovertebral arthropathy and right worse than left facet arthropathy without high-grade spinal canal or neural foraminal stenosis C3-C4: There is a broad-based disc protrusion and uncovertebral spurring left worse than right resulting in mild spinal canal stenosis and moderate left worse than right neural foraminal stenosis C4-C5: There is left worse than right uncovertebral spurring and mild bilateral facet arthropathy resulting in at least mild spinal canal stenosis and moderate left and mild right neural foraminal stenosis C5-C6: There is prominent bilateral uncovertebral spurring and a shallow disc protrusion resulting in at least mild spinal canal stenosis and  moderate to severe bilateral neural foraminal stenosis C6-C7: There is prominent uncovertebral spurring and a possible central disc protrusion resulting in severe bilateral neural foraminal stenosis and possible moderate or severe spinal canal stenosis, suboptimally assessed C7-T1: There is bilateral facet arthropathy resulting in moderate bilateral neural foraminal stenosis without convincing high-grade spinal canal stenosis. Upper chest: The imaged lung apices are clear. Other: None. IMPRESSION: 1. No acute finding in the cervical spine. 2. Multilevel degenerative changes as above with prominent uncovertebral spurring resulting in moderate to severe bilateral neural foraminal stenosis at C5-C6 and severe bilateral neural foraminal stenosis at C6-C7. There may be a disc protrusion at C6-C7 with possible moderate spinal canal stenosis, suboptimally assessed due to streak artifact. MRI may be considered for better evaluation as indicated. Electronically Signed   By: Lesia Hausen M.D.   On: 03/29/2022 14:14    Procedures Procedures    Medications Ordered in ED Medications  predniSONE (DELTASONE) tablet 60 mg (60 mg  Oral Given 03/29/22 1544)    ED Course/ Medical Decision Making/ A&P                           Medical Decision Making Patient presenting with intermittent episodes of upper extremity neuropathy, currently resolved.  There is no weakness involved with these episodes and no chest pain, symptoms do not suggest CVA or TIA.  CT imaging per below.  He does have significant cervical degenerative changes and in possible disc protrusion which certainly can explain his symptoms.  He has been placed on a prednisone taper and referred to neurosurgery for further evaluation and management.  Patient was symptom-free at time of discharge.  He agrees with and understands the plan going forward.  Return precautions were outlined.  Amount and/or Complexity of Data Reviewed Labs: ordered.    Details: Labs unremarkable. Radiology: ordered.    Details: Bilateral neural foraminal stenosis at the C5-C7 levels.  Concern also for disc protrusion at the C6-C7 level.  Per CT imaging.  MRI may offer better evaluation which he may need, pending discretion of his neurosurgeon.  Referral given.  Risk Prescription drug management.           Final Clinical Impression(s) / ED Diagnoses Final diagnoses:  Cervical radiculopathy  Cervical stenosis of spinal canal    Rx / DC Orders ED Discharge Orders          Ordered    predniSONE (DELTASONE) 10 MG tablet        03/29/22 1519              Burgess Amor, PA-C 03/29/22 1925    Gloris Manchester, MD 03/30/22 [phone]
